# Patient Record
Sex: Male | Born: 1975 | Race: White | Hispanic: No | Marital: Married | Smoking: Former smoker
Health system: Southern US, Community
[De-identification: ages and names within clinical notes are randomized; demographics above are authoritative.]

## PROBLEM LIST (undated history)

## (undated) DIAGNOSIS — J343 Hypertrophy of nasal turbinates: Secondary | ICD-10-CM

## (undated) DIAGNOSIS — F32A Depression, unspecified: Secondary | ICD-10-CM

## (undated) DIAGNOSIS — G4730 Sleep apnea, unspecified: Secondary | ICD-10-CM

## (undated) DIAGNOSIS — K219 Gastro-esophageal reflux disease without esophagitis: Secondary | ICD-10-CM

## (undated) DIAGNOSIS — F329 Major depressive disorder, single episode, unspecified: Secondary | ICD-10-CM

## (undated) DIAGNOSIS — E7800 Pure hypercholesterolemia, unspecified: Secondary | ICD-10-CM

## (undated) DIAGNOSIS — J342 Deviated nasal septum: Secondary | ICD-10-CM

## (undated) DIAGNOSIS — F419 Anxiety disorder, unspecified: Secondary | ICD-10-CM

## (undated) DIAGNOSIS — I10 Essential (primary) hypertension: Secondary | ICD-10-CM

## (undated) HISTORY — DX: Pure hypercholesterolemia, unspecified: E78.00

## (undated) HISTORY — PX: WISDOM TOOTH EXTRACTION: SHX21

## (undated) HISTORY — DX: Essential (primary) hypertension: I10

---

## 2018-03-25 ENCOUNTER — Encounter: Payer: Self-pay | Admitting: Neurology

## 2018-03-26 ENCOUNTER — Ambulatory Visit: Payer: Commercial Managed Care - PPO | Admitting: Neurology

## 2018-03-26 ENCOUNTER — Encounter: Payer: Self-pay | Admitting: Neurology

## 2018-03-26 VITALS — BP 145/82 | HR 73 | Ht 71.0 in | Wt 238.0 lb

## 2018-03-26 DIAGNOSIS — Z9189 Other specified personal risk factors, not elsewhere classified: Secondary | ICD-10-CM | POA: Diagnosis not present

## 2018-03-26 DIAGNOSIS — G4733 Obstructive sleep apnea (adult) (pediatric): Secondary | ICD-10-CM

## 2018-03-26 NOTE — Progress Notes (Signed)
Subjective:    Patient ID: Geoffrey Barrera is a 42 y.o. male.  HPI     Star Age, MD, PhD [facility] [address] [place], [place] [address]  Dear Rennis Petty,   I saw your patient, Geoffrey Barrera, upon your kind request in my neurologic clinic today for initial consultation of his sleep disorder, in particular, his prior diagnosis of sleep apnea for which he has been on BiPAP therapy. The patient is accompanied by his wife today. As you know, Geoffrey Barrera is a 42 year old right-handed gentleman with an underlying medical history of hypertension, hyperlipidemia, reflux disease and obesity, who was previously diagnosed with obstructive sleep apnea and placed on BiPAP therapy. He had sleep study testing in July 2012. I was able to review the results. His baseline AHI was 22.9 per hour, he was then placed on BiPAP therapy, 14/9 cm via full facemask. He has been compliant with treatment. A couple months ago he started having grafting sensations at night, sometimes he woke up in a panic. This prompted him to go to the emergency room twice in February. He was treated for a sinus infection, he has seen ENT and was diagnosed with a septal deviation. He has discontinued the use of nasal decongestant spray and admits that he was overusing this. He has started utilizing allergy medication and also saline rinses. Some 2 months ago he was able to receive a new BiPAP machine. I reviewed his BiPAP compliance data from 02/24/2018 through 03/25/2018 which is a total of 30 days, during which time he used his machine every night with percent used days greater than 4 hours at 100%, indicating superb compliance with an average usage of 8 hours and 18 minutes, residual AHI borderline at 6.6, leak in the acceptable range at 19.9 L/m for the 95th percentile, pressure at 14/9 cm. He uses a large area of 24 facemask and is tolerant of this mask. He has had less panic-like feeling  at night unless gasping for air sensation. He does admit to being prone to anxiety and has had the occasional panic attack in the past. He has not address this before. 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 He lives with his wife and 2 children. He quit smoking some 7 years ago and drinks alcohol occasionally, caffeine occasionally in the form of tea. Since his original sleep apnea diagnosis he had gained a lot of weight, in the realm of 60 pounds, he has started working on weight loss and has already been able to lose weight. He started walking on a regular basis. His Epworth sleepiness score is 14 out of 24, fatigue score is 45 out of 63. I reviewed your office note from 03/05/2018, which you kindly included.   His Past Medical History Is Significant For: Past Medical History:  Diagnosis Date  . High cholesterol   . Hypertension    His Past Surgical History Is Significant For:  His Family History Is Significant For: Family History  Problem Relation Age of Onset  . Cancer Maternal Grandmother   . Stroke Maternal Grandfather     His Social History Is Significant For: Social History   Socioeconomic History  . Marital status: Married    Spouse name: Not on file  . Number of children: Not on file  . Years of education: Not on file  . Highest education level: Not on file  Occupational History  . Not on file  Social Needs  . Financial resource strain: Not on file  . Food insecurity:    Worry: Not on file    Inability: Not on file  . Transportation needs:    Medical: Not on file    Non-medical: Not on file  Tobacco Use  . Smoking status: Former Research scientist (life sciences)  . Smokeless tobacco: Never Used  Substance and Sexual Activity  . Alcohol use: Yes    Alcohol/week: 1.2 oz    Types: 2 Standard drinks or equivalent per week  . Drug use: Not on file  . Sexual activity: Not on file  Lifestyle  . Physical activity:    Days per week: Not on file    Minutes per session:  Not on file  . Stress: Not on file  Relationships  . Social connections:    Talks on phone: Not on file    Gets together: Not on file    Attends religious service: Not on file    Active member of club or organization: Not on file    Attends meetings of clubs or organizations: Not on file    Relationship status: Not on file  Other Topics Concern  . Not on file  Social History Narrative  . Not on file    His Allergies Are:  Allergies  Allergen Reactions  . Latex   :   His Current Medications Are:  Outpatient Encounter Medications as of 03/26/2018  Medication Sig  . cetirizine (ZYRTEC) 10 MG tablet Take 10 mg by mouth daily.  . diphenhydrAMINE (BENADRYL) 25 MG tablet Take 50 mg by mouth daily.  . fluticasone (FLONASE) 50 MCG/ACT nasal spray 2 sprays 2 (two) times daily.  Marland Kitchen ibuprofen (ADVIL,MOTRIN) 200 MG tablet Take 200 mg by mouth every 6 (six) hours as needed.  Marland Kitchen lisinopril (PRINIVIL,ZESTRIL) 20 MG tablet Take 20 mg by mouth daily.  . Melatonin 5 MG TABS Take by mouth.  . Multiple Vitamin (MULTIVITAMIN) capsule Take 1 capsule by mouth daily.  . ranitidine (ZANTAC) 150 MG tablet Take 150 mg by mouth daily.  . simvastatin (ZOCOR) 20 MG tablet Take 20 mg by mouth daily.   No facility-administered encounter medications on file as of 03/26/2018.   :  Review of Systems:  Out of a complete 14 point review of systems, all are reviewed and negative with the exception of these symptoms as listed below: Review of Systems  Neurological:       Pt presents today to discuss his sleep. Pt has been on bipap sine 2012. Recently, he noticed was gasping for breath when trying to go to sleep and had trouble sleeping. Pt received a new bipap from [facility] in March, ordered by his PCP.  Epworth Sleepiness Scale 0= would never doze 1= slight chance of dozing 2= moderate chance of dozing 3= high chance of dozing  Sitting and reading: 3 Watching TV: 3 Sitting inactive in a public place (ex. Theater  or meeting): 1 As a passenger in a car for an hour without a break: 3 Lying down to rest in the afternoon: 3 Sitting and talking to someone: 0 Sitting quietly after lunch (no alcohol): 1 In a car, while stopped in traffic: 0 Total: 14     Objective:  Neurological Exam  Physical Exam Physical Examination:   Vitals:   03/26/18 1050  BP: (!) 145/82  Pulse: 73   General Examination: The patient is a very pleasant 42 y.o. male in no acute distress. He appears well-developed and well-nourished and  well groomed. Full beard. He is mildly anxious appearing.  HEENT: Normocephalic, atraumatic, pupils are equal, round and reactive to light. Extraocular tracking is good without limitation to gaze excursion or nystagmus noted. Normal smooth pursuit is noted. Hearing is grossly intact. Tympanic membranes are clear bilaterally. Face is symmetric with normal facial animation and normal facial sensation. Speech is clear with no dysarthria noted. There is no hypophonia. There is no lip, neck/head, jaw or voice tremor. Neck is supple with full range of passive and active motion. There are no carotid bruits on auscultation. Oropharynx exam reveals: mild mouth dryness, adequate dental hygiene and moderate airway crowding, due to smaller airway entry, longer uvula, wider tongue. Tonsils are small. Mallampati is class III. Neck circumference is 18-1/2 inches. He has a mild overbite. Nasal inspection reveals no significant mucosal bogginess or redness but he has mild septal deviation to the right.  Chest: Clear to auscultation without wheezing, rhonchi or crackles noted.  Heart: S1+S2+0, regular and normal without murmurs, rubs or gallops noted.   Abdomen: Soft, non-tender and non-distended with normal bowel sounds appreciated on auscultation.  Extremities: There is no obvious edema.  Skin: Warm and dry without trophic changes noted.  Musculoskeletal: exam reveals no obvious joint deformities, tenderness  or joint swelling or erythema.   Neurologically:  Mental status: The patient is awake, alert and oriented in all 4 spheres. His immediate and remote memory, attention, language skills and fund of knowledge are appropriate. There is no evidence of aphasia, agnosia, apraxia or anomia. Speech is clear with normal prosody and enunciation. Thought process is linear. Mood is normal and affect is slightly blunted.  Cranial nerves II - XII are as described above under HEENT exam. In addition: shoulder shrug is normal with equal shoulder height noted. Motor exam: Normal bulk, strength and tone is noted. There is no tremor. Fine motor skills and coordination: grossly intact.  Cerebellar testing: No dysmetria or intention tremor on finger to nose testing. Heel to shin is unremarkable bilaterally. There is no truncal or gait ataxia.  Sensory exam: intact to light touch.  Gait, station and balance: He stands easily. No veering to one side is noted. No leaning to one side is noted. Posture is age-appropriate and stance is narrow based. Gait shows normal stride length and normal pace. No problems turning are noted.   Assessment and Plan:  In summary, Geoffrey Barrera is a very pleasant 42 y.o.-year old male with an underlying medical history of hypertension, hyperlipidemia, reflux disease and obesity, who was Was diagnosed with moderate obstructive sleep apnea in 2012 and placed on BiPAP therapy. He had recent issues with waking up with a sense of gasping for air. He had interim weight gain since his original sleep apnea diagnosis has been able to lose weight in the past 2 months or so. He is commended for his weight loss endeavors. He is fully compliant with BiPAP therapy and has been able to get a new machine in the past couple of months. His sleep apnea is well treated at this time, residual AHI in the past month borderline at 6.6, nevertheless, with ongoing weight loss this will likely improve. He is encouraged to  continue to work on weight loss and be fully compliant with his BiPAP, he is commended for his BiPAP treatment adherence. He is utilizing a full facemask size large at this time. He is tolerant of the mask and does not mind using a fullface mask, leak is acceptable as well. From  my end of things, nothing needs to be changed at this time. He does admit to having occasional anxiety and waking up with a panic. His recent emergency room visits may have been in part triggered by anxiety, he was also treated with oral steroids and oral antibiotics recently for an acute on chronic sinusitis. He has seen ENT. He is on allergy medication and has discontinued his use of nasal decongestant spray which he by self admission was overusing. I encouraged the patient to talk to you about anxiety management and stress. I will see him back on an as-needed basis. I answered all their questions today and the patient and his wife were in agreement. Of note, his wife is a respiratory therapist and is very familiar with CPAP and BiPAP machines and has been very supportive.  Thank you very much for allowing me to participate in the care of this nice patient. If I can be of any further assistance to you please do not hesitate to call me at [phone].  Sincerely,   Star Age, MD, PhD

## 2018-03-26 NOTE — Patient Instructions (Addendum)
Please continue using your BiPAP regularly. While your insurance requires that you use BiPAP at least 4 hours each night on 70% of the nights, I recommend, that you not skip any nights and use it throughout the night if you can. Getting used to BiPAP and staying with the treatment long term does take time and patience and discipline. Untreated obstructive sleep apnea when it is moderate to severe can have an adverse impact on cardiovascular health and raise her risk for heart disease, arrhythmias, hypertension, congestive heart failure, stroke and diabetes. Untreated obstructive sleep apnea causes sleep disruption, nonrestorative sleep, and sleep deprivation. This can have an impact on your day to day functioning and cause daytime sleepiness and impairment of cognitive function, memory loss, mood disturbance, and problems focussing. Using BiPAP regularly can improve these symptoms.  You have excellent compliance with your BiPAP machine.  I do not recommend any changes in your pressure settings or your mask at this time. You can follow-up with your primary care physician. I would recommend that you talk to him about your anxiety and possible management strategies for anxiety and stress.  I will see you back on an as-needed basis. Keep up the good work with your BiPAP usage and also your weight loss endeavors.

## 2018-05-18 ENCOUNTER — Ambulatory Visit (INDEPENDENT_AMBULATORY_CARE_PROVIDER_SITE_OTHER): Payer: Commercial Managed Care - PPO | Admitting: Otolaryngology

## 2018-05-18 DIAGNOSIS — J320 Chronic maxillary sinusitis: Secondary | ICD-10-CM

## 2018-05-18 DIAGNOSIS — J343 Hypertrophy of nasal turbinates: Secondary | ICD-10-CM

## 2018-05-18 DIAGNOSIS — J342 Deviated nasal septum: Secondary | ICD-10-CM

## 2018-05-29 ENCOUNTER — Other Ambulatory Visit: Payer: Self-pay | Admitting: Otolaryngology

## 2018-06-30 ENCOUNTER — Encounter (HOSPITAL_BASED_OUTPATIENT_CLINIC_OR_DEPARTMENT_OTHER): Payer: Self-pay | Admitting: *Deleted

## 2018-06-30 ENCOUNTER — Other Ambulatory Visit: Payer: Self-pay

## 2018-07-02 ENCOUNTER — Other Ambulatory Visit (INDEPENDENT_AMBULATORY_CARE_PROVIDER_SITE_OTHER): Payer: Self-pay | Admitting: Internal Medicine

## 2018-07-02 ENCOUNTER — Encounter (INDEPENDENT_AMBULATORY_CARE_PROVIDER_SITE_OTHER): Payer: Self-pay | Admitting: Internal Medicine

## 2018-07-02 ENCOUNTER — Encounter (HOSPITAL_COMMUNITY)
Admission: RE | Admit: 2018-07-02 | Discharge: 2018-07-02 | Disposition: A | Discharge status: Home / Self Care | Payer: Commercial Managed Care - PPO | Source: Ambulatory Visit | Attending: Otolaryngology | Admitting: Otolaryngology

## 2018-07-02 ENCOUNTER — Ambulatory Visit (INDEPENDENT_AMBULATORY_CARE_PROVIDER_SITE_OTHER): Payer: Commercial Managed Care - PPO | Admitting: Internal Medicine

## 2018-07-02 VITALS — BP 160/80 | HR 80 | Temp 98.4°F | Ht 71.0 in | Wt 234.5 lb

## 2018-07-02 DIAGNOSIS — K625 Hemorrhage of anus and rectum: Secondary | ICD-10-CM | POA: Diagnosis not present

## 2018-07-02 DIAGNOSIS — I10 Essential (primary) hypertension: Secondary | ICD-10-CM | POA: Insufficient documentation

## 2018-07-02 DIAGNOSIS — R001 Bradycardia, unspecified: Secondary | ICD-10-CM | POA: Diagnosis not present

## 2018-07-02 DIAGNOSIS — R1013 Epigastric pain: Secondary | ICD-10-CM

## 2018-07-02 DIAGNOSIS — Z01810 Encounter for preprocedural cardiovascular examination: Secondary | ICD-10-CM | POA: Insufficient documentation

## 2018-07-02 NOTE — Patient Instructions (Signed)
EGD/Colonoscopy with propofol. The risks of bleeding, perforation and infection were reviewed with patient.

## 2018-07-02 NOTE — Progress Notes (Signed)
Subjective:    Patient ID: Geoffrey Barrera, male    DOB: 1976/06/07, 42 y.o.   MRN: [id number]  HPI Referred by Dr. Woody Seller for bloody stools. Seen at [facility] in July with epigastric pain.  Also has been having intermittent blood in his stool.  Underwent a CT scan for abdominal pain for a week. There were no acute findings. Colonic diverticulosis with radiographic evidence of diverticulitis.  He states he has epigastric pain and also states he has pain when he has BM.  He has seen [facility] (toilet tissue) at times.   He has soreness in his rectum with his BM. He has had the soreness for about 10 yrs. The epigastric pain for about 2 weeks. He had a gnawing sensation in his epigastric region in March. He started Omeprazole and his symptoms resolved. Seen in the ED in July and started on Omeprazole 20mg  and his symptoms resolved. He has finished the Carafate. He says he was taking Ibuprofen x 2 a day for months. Has stopped now.  Has a BM x 1 a day and sometimes 2 a day.  States his stools have been hard. He says he has lost from 265 to 234 which was intentional. He said he did not want to eat because of the pain.   States he has been drinking Citrucel which has helped with his stools. Stools are not as hard now. States has hx of IBS.  He says his appetite is back and he is gaining his weight back.    No family hx of colon cancer. Mother had Crohn's disease. Deceased now from cirrhosis from Hepatitis age 36. 02/20/2018 H and H 13.8 and 40.5  Review of Systems Past Medical History:  Diagnosis Date  . Anxiety   . Depression   . Deviated septum   . GERD (gastroesophageal reflux disease)   . High cholesterol   . Hypertension   . Hypertrophy of nasal turbinates   . Sleep apnea    wears BIPAP nightly    Past Surgical History:  Procedure Laterality Date  . WISDOM TOOTH EXTRACTION      Allergies  Allergen Reactions  . Latex     Current Outpatient Medications on File Prior to Visit  Medication  Sig Dispense Refill  . busPIRone (BUSPAR) 15 MG tablet Take 15 mg by mouth as needed.    . cetirizine (ZYRTEC) 10 MG tablet Take 10 mg by mouth daily.    . diphenhydrAMINE (BENADRYL) 25 MG tablet Take 50 mg by mouth daily.    . fluticasone (FLONASE) 50 MCG/ACT nasal spray 2 sprays 2 (two) times daily.  2  . ibuprofen (ADVIL,MOTRIN) 200 MG tablet Take 200 mg by mouth every 6 (six) hours as needed.    Marland Kitchen lisinopril (PRINIVIL,ZESTRIL) 20 MG tablet Take 20 mg by mouth daily.  4  . Multiple Vitamin (MULTIVITAMIN) capsule Take 1 capsule by mouth daily.    Marland Kitchen omeprazole (PRILOSEC) 20 MG capsule Take 20 mg by mouth daily.    Marland Kitchen PARoxetine (PAXIL) 10 MG tablet Take 10 mg by mouth daily.    . simvastatin (ZOCOR) 20 MG tablet Take 20 mg by mouth daily.  4  . sucralfate (CARAFATE) 1 g tablet Take 1 g by mouth 4 (four) times daily -  with meals and at bedtime.     No current facility-administered medications on file prior to visit.         Objective:   Physical Exam Blood pressure (!) 160/80, pulse 80,  temperature 98.4 F (36.9 C), height 5\' 11"  (1.803 m), weight 234 lb 8 oz (106.4 kg). Alert and oriented. Skin warm and dry. Oral mucosa is moist.   . Sclera anicteric, conjunctivae is pink. Thyroid not enlarged. No cervical lymphadenopathy. Lungs clear. Heart regular rate and rhythm.  Abdomen is soft. Bowel sounds are positive. No hepatomegaly. No abdominal masses felt. No tenderness.  No edema to lower extremities. Rectal exam: No masses felt. Guaiac negative.       Assessment & Plan:  Rectal bleeding. Colon neoplasm needs to be ruled out. Hemorrhoids,polyps in the differential. Epigastric pain: Had been taking Ibuprofen which he has stopped. Symptoms are better but I think he needs EGD to evaluate for possible ulcer or gastritis.

## 2018-07-03 ENCOUNTER — Encounter (INDEPENDENT_AMBULATORY_CARE_PROVIDER_SITE_OTHER): Payer: Self-pay | Admitting: *Deleted

## 2018-07-03 ENCOUNTER — Telehealth (INDEPENDENT_AMBULATORY_CARE_PROVIDER_SITE_OTHER): Payer: Self-pay | Admitting: *Deleted

## 2018-07-03 DIAGNOSIS — R1013 Epigastric pain: Secondary | ICD-10-CM | POA: Insufficient documentation

## 2018-07-03 DIAGNOSIS — K625 Hemorrhage of anus and rectum: Secondary | ICD-10-CM | POA: Insufficient documentation

## 2018-07-03 NOTE — Telephone Encounter (Signed)
Patient needs suprep 

## 2018-07-06 ENCOUNTER — Encounter (HOSPITAL_BASED_OUTPATIENT_CLINIC_OR_DEPARTMENT_OTHER): Payer: Self-pay

## 2018-07-06 ENCOUNTER — Ambulatory Visit (HOSPITAL_BASED_OUTPATIENT_CLINIC_OR_DEPARTMENT_OTHER)
Admission: RE | Admit: 2018-07-06 | Discharge: 2018-07-07 | Disposition: A | Discharge status: Home / Self Care | Payer: Commercial Managed Care - PPO | Source: Ambulatory Visit | Attending: Otolaryngology | Admitting: Otolaryngology

## 2018-07-06 ENCOUNTER — Encounter (HOSPITAL_BASED_OUTPATIENT_CLINIC_OR_DEPARTMENT_OTHER)
Admission: RE | Disposition: A | Discharge status: Home / Self Care | Payer: Self-pay | Source: Ambulatory Visit | Attending: Otolaryngology

## 2018-07-06 ENCOUNTER — Ambulatory Visit (HOSPITAL_BASED_OUTPATIENT_CLINIC_OR_DEPARTMENT_OTHER): Payer: Commercial Managed Care - PPO | Admitting: Anesthesiology

## 2018-07-06 ENCOUNTER — Other Ambulatory Visit: Payer: Self-pay

## 2018-07-06 DIAGNOSIS — Z79899 Other long term (current) drug therapy: Secondary | ICD-10-CM | POA: Diagnosis not present

## 2018-07-06 DIAGNOSIS — K625 Hemorrhage of anus and rectum: Secondary | ICD-10-CM

## 2018-07-06 DIAGNOSIS — J3489 Other specified disorders of nose and nasal sinuses: Secondary | ICD-10-CM | POA: Insufficient documentation

## 2018-07-06 DIAGNOSIS — J338 Other polyp of sinus: Secondary | ICD-10-CM | POA: Insufficient documentation

## 2018-07-06 DIAGNOSIS — K219 Gastro-esophageal reflux disease without esophagitis: Secondary | ICD-10-CM | POA: Insufficient documentation

## 2018-07-06 DIAGNOSIS — J320 Chronic maxillary sinusitis: Secondary | ICD-10-CM | POA: Insufficient documentation

## 2018-07-06 DIAGNOSIS — Z823 Family history of stroke: Secondary | ICD-10-CM | POA: Insufficient documentation

## 2018-07-06 DIAGNOSIS — Z9889 Other specified postprocedural states: Secondary | ICD-10-CM

## 2018-07-06 DIAGNOSIS — Z87891 Personal history of nicotine dependence: Secondary | ICD-10-CM | POA: Diagnosis not present

## 2018-07-06 DIAGNOSIS — G4730 Sleep apnea, unspecified: Secondary | ICD-10-CM | POA: Diagnosis not present

## 2018-07-06 DIAGNOSIS — Z7951 Long term (current) use of inhaled steroids: Secondary | ICD-10-CM | POA: Insufficient documentation

## 2018-07-06 DIAGNOSIS — J342 Deviated nasal septum: Secondary | ICD-10-CM

## 2018-07-06 DIAGNOSIS — J343 Hypertrophy of nasal turbinates: Secondary | ICD-10-CM | POA: Diagnosis present

## 2018-07-06 DIAGNOSIS — I10 Essential (primary) hypertension: Secondary | ICD-10-CM | POA: Diagnosis not present

## 2018-07-06 DIAGNOSIS — R1013 Epigastric pain: Secondary | ICD-10-CM

## 2018-07-06 HISTORY — DX: Sleep apnea, unspecified: G47.30

## 2018-07-06 HISTORY — DX: Hypertrophy of nasal turbinates: J34.3

## 2018-07-06 HISTORY — PX: NASAL SINUS SURGERY: SHX719

## 2018-07-06 HISTORY — DX: Gastro-esophageal reflux disease without esophagitis: K21.9

## 2018-07-06 HISTORY — DX: Anxiety disorder, unspecified: F41.9

## 2018-07-06 HISTORY — DX: Major depressive disorder, single episode, unspecified: F32.9

## 2018-07-06 HISTORY — DX: Deviated nasal septum: J34.2

## 2018-07-06 HISTORY — PX: NASAL SEPTOPLASTY W/ TURBINOPLASTY: SHX2070

## 2018-07-06 HISTORY — DX: Depression, unspecified: F32.A

## 2018-07-06 SURGERY — SEPTOPLASTY, NOSE, WITH NASAL TURBINATE REDUCTION
Anesthesia: General | Site: Nose | Laterality: Bilateral

## 2018-07-06 MED ORDER — CEFAZOLIN SODIUM-DEXTROSE 2-3 GM-%(50ML) IV SOLR
INTRAVENOUS | Status: DC | PRN
  Administered 2018-07-06: 2 g via INTRAVENOUS

## 2018-07-06 MED ORDER — LISINOPRIL 20 MG PO TABS: 20.0000 mg | ORAL_TABLET | Freq: Every day | ORAL | Status: DC

## 2018-07-06 MED ORDER — PROPOFOL 10 MG/ML IV BOLUS
INTRAVENOUS | Status: DC | PRN
  Administered 2018-07-06: 250 mg via INTRAVENOUS

## 2018-07-06 MED ORDER — OXYMETAZOLINE HCL 0.05 % NA SOLN
NASAL | Status: DC | PRN
  Administered 2018-07-06: 1 via TOPICAL

## 2018-07-06 MED ORDER — DEXAMETHASONE SODIUM PHOSPHATE 4 MG/ML IJ SOLN
INTRAMUSCULAR | Status: DC | PRN
  Administered 2018-07-06: 10 mg via INTRAVENOUS

## 2018-07-06 MED ORDER — OXYCODONE-ACETAMINOPHEN 5-325 MG PO TABS
1.0000 | ORAL_TABLET | ORAL | Status: DC | PRN
  Administered 2018-07-06 – 2018-07-07 (×3): 2 via ORAL
  Filled 2018-07-06 (×5): qty 2

## 2018-07-06 MED ORDER — AMOXICILLIN 875 MG PO TABS: 875.0000 mg | ORAL_TABLET | Freq: Two times a day (BID) | ORAL | 0 refills | Status: AC

## 2018-07-06 MED ORDER — MIDAZOLAM HCL 2 MG/2ML IJ SOLN
1.0000 mg | INTRAMUSCULAR | Status: DC | PRN
  Administered 2018-07-06: 2 mg via INTRAVENOUS

## 2018-07-06 MED ORDER — SODIUM CHLORIDE 0.9 % IR SOLN
Status: DC | PRN
  Administered 2018-07-06: 100 mL

## 2018-07-06 MED ORDER — FENTANYL CITRATE (PF) 100 MCG/2ML IJ SOLN
50.0000 ug | INTRAMUSCULAR | Status: AC | PRN
  Administered 2018-07-06: 25 ug via INTRAVENOUS
  Administered 2018-07-06: 100 ug via INTRAVENOUS
  Administered 2018-07-06: 25 ug via INTRAVENOUS

## 2018-07-06 MED ORDER — MIDAZOLAM HCL 2 MG/2ML IJ SOLN
INTRAMUSCULAR | Status: AC
  Filled 2018-07-06: qty 2

## 2018-07-06 MED ORDER — CHLORHEXIDINE GLUCONATE CLOTH 2 % EX PADS: 6.0000 | MEDICATED_PAD | Freq: Once | CUTANEOUS | Status: DC

## 2018-07-06 MED ORDER — ONDANSETRON HCL 4 MG PO TABS: 4.0000 mg | ORAL_TABLET | ORAL | Status: DC | PRN

## 2018-07-06 MED ORDER — FENTANYL CITRATE (PF) 100 MCG/2ML IJ SOLN
INTRAMUSCULAR | Status: AC
  Filled 2018-07-06: qty 2

## 2018-07-06 MED ORDER — LORATADINE 10 MG PO TABS: 10.0000 mg | ORAL_TABLET | Freq: Every day | ORAL | Status: DC

## 2018-07-06 MED ORDER — MEPERIDINE HCL 25 MG/ML IJ SOLN: 6.2500 mg | INTRAMUSCULAR | Status: DC | PRN

## 2018-07-06 MED ORDER — PANTOPRAZOLE SODIUM 40 MG PO TBEC: 40.0000 mg | DELAYED_RELEASE_TABLET | Freq: Every day | ORAL | Status: DC

## 2018-07-06 MED ORDER — DIPHENHYDRAMINE HCL 25 MG PO TABS: 50.0000 mg | ORAL_TABLET | Freq: Every day | ORAL | Status: DC

## 2018-07-06 MED ORDER — LIDOCAINE 2% (20 MG/ML) 5 ML SYRINGE
INTRAMUSCULAR | Status: AC
  Filled 2018-07-06: qty 5

## 2018-07-06 MED ORDER — ROCURONIUM BROMIDE 100 MG/10ML IV SOLN
INTRAVENOUS | Status: DC | PRN
  Administered 2018-07-06: 50 mg via INTRAVENOUS

## 2018-07-06 MED ORDER — LIDOCAINE-EPINEPHRINE 1 %-1:100000 IJ SOLN
INTRAMUSCULAR | Status: DC | PRN
  Administered 2018-07-06: 4 mL

## 2018-07-06 MED ORDER — ONDANSETRON HCL 4 MG/2ML IJ SOLN
INTRAMUSCULAR | Status: AC
  Filled 2018-07-06: qty 2

## 2018-07-06 MED ORDER — PAROXETINE HCL 10 MG PO TABS: 10.0000 mg | ORAL_TABLET | Freq: Every day | ORAL | Status: DC

## 2018-07-06 MED ORDER — KCL IN DEXTROSE-NACL 20-5-0.45 MEQ/L-%-% IV SOLN
INTRAVENOUS | Status: DC
  Administered 2018-07-06: 16:00:00 via INTRAVENOUS
  Filled 2018-07-06: qty 1000

## 2018-07-06 MED ORDER — METOCLOPRAMIDE HCL 5 MG/ML IJ SOLN: 10.0000 mg | Freq: Once | INTRAMUSCULAR | Status: DC | PRN

## 2018-07-06 MED ORDER — MUPIROCIN 2 % EX OINT
TOPICAL_OINTMENT | CUTANEOUS | Status: DC | PRN
  Administered 2018-07-06: 1 via NASAL

## 2018-07-06 MED ORDER — ONDANSETRON HCL 4 MG/2ML IJ SOLN
INTRAMUSCULAR | Status: DC | PRN
  Administered 2018-07-06: 4 mg via INTRAVENOUS

## 2018-07-06 MED ORDER — OXYCODONE-ACETAMINOPHEN 5-325 MG PO TABS: 1.0000 | ORAL_TABLET | ORAL | 0 refills | Status: DC | PRN

## 2018-07-06 MED ORDER — DEXAMETHASONE SODIUM PHOSPHATE 10 MG/ML IJ SOLN
INTRAMUSCULAR | Status: AC
  Filled 2018-07-06: qty 1

## 2018-07-06 MED ORDER — SIMVASTATIN 20 MG PO TABS: 20.0000 mg | ORAL_TABLET | Freq: Every day | ORAL | Status: DC

## 2018-07-06 MED ORDER — PROPOFOL 10 MG/ML IV BOLUS
INTRAVENOUS | Status: AC
  Filled 2018-07-06: qty 20

## 2018-07-06 MED ORDER — LACTATED RINGERS IV SOLN: INTRAVENOUS | Status: DC

## 2018-07-06 MED ORDER — FENTANYL CITRATE (PF) 100 MCG/2ML IJ SOLN
25.0000 ug | INTRAMUSCULAR | Status: DC | PRN
  Administered 2018-07-06: 25 ug via INTRAVENOUS
  Administered 2018-07-06: 50 ug via INTRAVENOUS

## 2018-07-06 MED ORDER — MORPHINE SULFATE (PF) 4 MG/ML IV SOLN: 2.0000 mg | INTRAVENOUS | Status: DC | PRN

## 2018-07-06 MED ORDER — LACTATED RINGERS IV SOLN
INTRAVENOUS | Status: DC
  Administered 2018-07-06 (×2): via INTRAVENOUS

## 2018-07-06 MED ORDER — SCOPOLAMINE 1 MG/3DAYS TD PT72: 1.0000 | MEDICATED_PATCH | Freq: Once | TRANSDERMAL | Status: DC | PRN

## 2018-07-06 MED ORDER — ONDANSETRON HCL 4 MG/2ML IJ SOLN: 4.0000 mg | INTRAMUSCULAR | Status: DC | PRN

## 2018-07-06 MED ORDER — SUGAMMADEX SODIUM 200 MG/2ML IV SOLN
INTRAVENOUS | Status: DC | PRN
  Administered 2018-07-06: 200 mg via INTRAVENOUS

## 2018-07-06 SURGICAL SUPPLY — 55 items
ATTRACTOMAT 16X20 MAGNETIC DRP (DRAPES) IMPLANT
BLADE RAD40 ROTATE 4M 4 5PK (BLADE) IMPLANT
BLADE RAD40 ROTATE 4M 4MM 5PK (BLADE)
BLADE RAD60 ROTATE M4 4 5PK (BLADE) IMPLANT
BLADE RAD60 ROTATE M4 4MM 5PK (BLADE)
BLADE TRICUT ROTATE M4 4 5PK (BLADE) IMPLANT
BLADE TRICUT ROTATE M4 4MM 5PK (BLADE)
BUR HS RAD FRONTAL 3 (BURR) IMPLANT
BUR HS RAD FRONTAL 3MM (BURR)
CANISTER SUC SOCK COL 7IN (MISCELLANEOUS) ×3 IMPLANT
CANISTER SUCT 1200ML W/VALVE (MISCELLANEOUS) ×3 IMPLANT
COAGULATOR SUCT 6 FR SWTCH (ELECTROSURGICAL)
COAGULATOR SUCT 8FR VV (MISCELLANEOUS) ×3 IMPLANT
COAGULATOR SUCT SWTCH 10FR 6 (ELECTROSURGICAL) IMPLANT
DECANTER SPIKE VIAL GLASS SM (MISCELLANEOUS) IMPLANT
DRSG NASAL KENNEDY LMNT 8CM (GAUZE/BANDAGES/DRESSINGS) IMPLANT
DRSG NASOPORE 8CM (GAUZE/BANDAGES/DRESSINGS) ×3 IMPLANT
DRSG TELFA 3X8 NADH (GAUZE/BANDAGES/DRESSINGS) IMPLANT
ELECT REM PT RETURN 9FT ADLT (ELECTROSURGICAL) ×3
ELECTRODE REM PT RTRN 9FT ADLT (ELECTROSURGICAL) ×1 IMPLANT
GLOVE BIO SURGEON STRL SZ7.5 (GLOVE) IMPLANT
GLOVE SURG SS PI 7.0 STRL IVOR (GLOVE) ×3 IMPLANT
GLOVE SURG SS PI 7.5 STRL IVOR (GLOVE) ×3 IMPLANT
GOWN STRL REUS W/ TWL LRG LVL3 (GOWN DISPOSABLE) ×1 IMPLANT
GOWN STRL REUS W/ TWL XL LVL3 (GOWN DISPOSABLE) ×1 IMPLANT
GOWN STRL REUS W/TWL LRG LVL3 (GOWN DISPOSABLE) ×2
GOWN STRL REUS W/TWL XL LVL3 (GOWN DISPOSABLE) ×2
HEMOSTAT SURGICEL 2X14 (HEMOSTASIS) IMPLANT
IV NS 500ML (IV SOLUTION) ×2
IV NS 500ML BAXH (IV SOLUTION) ×1 IMPLANT
IV SET EXT 30 76VOL 4 MALE LL (IV SETS) IMPLANT
NEEDLE HYPO 25X1 1.5 SAFETY (NEEDLE) ×3 IMPLANT
NEEDLE SPNL 25GX3.5 QUINCKE BL (NEEDLE) IMPLANT
NS IRRIG 1000ML POUR BTL (IV SOLUTION) ×3 IMPLANT
PACK BASIN DAY SURGERY FS (CUSTOM PROCEDURE TRAY) ×3 IMPLANT
PACK ENT DAY SURGERY (CUSTOM PROCEDURE TRAY) ×3 IMPLANT
PACKING NASAL EPIS 4X2.4 XEROG (MISCELLANEOUS) IMPLANT
SLEEVE SCD COMPRESS KNEE MED (MISCELLANEOUS) ×3 IMPLANT
SOLUTION BUTLER CLEAR DIP (MISCELLANEOUS) ×3 IMPLANT
SPLINT NASAL AIRWAY SILICONE (MISCELLANEOUS) ×3 IMPLANT
SPONGE GAUZE 2X2 8PLY STER LF (GAUZE/BANDAGES/DRESSINGS) ×1
SPONGE GAUZE 2X2 8PLY STRL LF (GAUZE/BANDAGES/DRESSINGS) ×2 IMPLANT
SPONGE NEURO XRAY DETECT 1X3 (DISPOSABLE) ×3 IMPLANT
SUT CHROMIC 4 0 P 3 18 (SUTURE) ×3 IMPLANT
SUT ETHILON 3 0 PS 1 (SUTURE) IMPLANT
SUT PLAIN 4 0 ~~LOC~~ 1 (SUTURE) ×3 IMPLANT
SUT PROLENE 3 0 PS 2 (SUTURE) ×3 IMPLANT
SUT VIC AB 4-0 P-3 18XBRD (SUTURE) IMPLANT
SUT VIC AB 4-0 P3 18 (SUTURE)
TOWEL GREEN STERILE FF (TOWEL DISPOSABLE) ×3 IMPLANT
TUBE CONNECTING 20'X1/4 (TUBING) ×1
TUBE CONNECTING 20X1/4 (TUBING) ×2 IMPLANT
TUBE SALEM SUMP 12R W/ARV (TUBING) IMPLANT
TUBE SALEM SUMP 16 FR W/ARV (TUBING) ×3 IMPLANT
YANKAUER SUCT BULB TIP NO VENT (SUCTIONS) ×3 IMPLANT

## 2018-07-06 NOTE — Progress Notes (Signed)
Left Dr. Benjamine Mola a voicemail to notify that we are still waiting on overnight orders and need them ASAP in order to transfer patient from PACU to [place].

## 2018-07-06 NOTE — Anesthesia Procedure Notes (Signed)
Procedure Name: Intubation Date/Time: 07/06/2018 11:14 AM Performed by: Lyndee Leo, CRNA Pre-anesthesia Checklist: Patient identified, Emergency Drugs available, Suction available and Patient being monitored Patient Re-evaluated:Patient Re-evaluated prior to induction Oxygen Delivery Method: Circle system utilized Preoxygenation: Pre-oxygenation with 100% oxygen Induction Type: IV induction Ventilation: Mask ventilation without difficulty Laryngoscope Size: Glidescope Grade View: Grade III Tube type: Oral Rae Tube size: 8.0 mm Number of attempts: 1 Airway Equipment and Method: Stylet and Oral airway Placement Confirmation: ETT inserted through vocal cords under direct vision,  positive ETCO2 and breath sounds checked- equal and bilateral Tube secured with: Tape Dental Injury: Teeth and Oropharynx as per pre-operative assessment

## 2018-07-06 NOTE — Discharge Instructions (Addendum)

## 2018-07-06 NOTE — H&P (Signed)
Cc: Chronic nasal obstruction, recurrent sinusitis  HPI: The patient is a 42 y/o male who presents today for evaluation of chronic nasal congestion and recurrent sinus infections. The patient has noted issues with his sinuses for many years. He underwent a sinus CT which showed a septal deviation, turbinate hypertrophy, and bilateral maxillary disease. The patient has been on Flonase since March with little to no relief of his symptoms. He has not been treated with any antibiotics recently. The patient has a history of sleep apnea and uses a biPap. No previous ENT surgery is noted.   The patient's review of systems (constitutional, eyes, ENT, cardiovascular, respiratory, GI, musculoskeletal, skin, neurologic, psychiatric, endocrine, hematologic, allergic) is noted in the ROS questionnaire.  It is reviewed with the patient.   Family health history: Cancer, stroke.   Major events: Hypertension.   Ongoing medical problems: Hypertension.   Social history: The patient is married. He denies the use of tobacco, alcohol or illegal drugs.  Exam: General: Communicates without difficulty, well nourished, no acute distress. Head: Normocephalic, no evidence injury, no tenderness, facial buttresses intact without stepoff. Eyes: PERRL, EOMI. No scleral icterus, conjunctivae clear. Neuro: CN II exam reveals vision grossly intact.  No nystagmus at any point of gaze. Ears: Auricles well formed without lesions.  Ear canals are intact without mass or lesion.  No erythema or edema is appreciated.  The TMs are intact without fluid. Nose: External evaluation reveals normal support and skin without lesions.  Dorsum is intact.  Anterior rhinoscopy reveals congested and edematous mucosa over anterior aspect of the inferior turbinates and nasal septum.  No purulence is noted. Middle meatus is not well visualized. Oral:  Oral cavity and oropharynx are intact, symmetric, without erythema or edema.  Mucosa is moist without  lesions. Neck: Full range of motion without pain.  There is no significant lymphadenopathy.  No masses palpable.  Thyroid bed within normal limits to palpation.  Parotid glands and submandibular glands equal bilaterally without mass.  Trachea is midline. Neuro:  CN 2-12 grossly intact. Gait normal. Vestibular: No nystagmus at any point of gaze.   Procedure:  Flexible Nasal Endoscopy: Risks, benefits, and alternatives of flexible endoscopy were explained to the patient.  Specific mention was made of the risk of throat numbness with difficulty swallowing, possible bleeding from the nose and mouth, and pain from the procedure.  The patient gave oral consent to proceed.  The nasal cavities were decongested and anesthetised with a combination of oxymetazoline and 4% lidocaine solution.  The flexible scope was inserted into the right nasal cavity.  NSD. Endoscopy of the inferior and middle meatus was performed.  The edematous mucosa was as described above.  Polypoid mucosa was noted to obstruct the middle meatus. Olfactory cleft was clear. Nasopharynx was clear.  Turbinates were hypertrophied but without mass.  Incomplete response to decongestion.  The procedure was repeated on the contralateral side with similar findings.  The patient tolerated the procedure well.  Instructions were given to avoid eating or drinking for 2 hours.   Assessment  1. 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  2. No other suspicious mass or lesion is noted on today's nasal endoscopy.  Plan 1. Nasal endoscopy findings are reviewed with the patient.  2. Treatment options include conservative management with daily steroid nasal spray versus septoplasty, bilateral inferior turbinate reduction, and bilateral maxillary  antrostomy. The risks, benefits, alternatives, and details of the  procedure are reviewed with the patient. Questions are invited and answered. 3. The patient is interested in proceeding with the procedures.  We will schedule the procedures in accordance with the family schedule.

## 2018-07-06 NOTE — Anesthesia Preprocedure Evaluation (Signed)
Anesthesia Evaluation  Patient identified by MRN, date of birth, ID band Patient awake    Reviewed: Allergy & Precautions, NPO status , Patient's Chart, lab work & pertinent test results  Airway Mallampati: II  TM Distance: >3 FB Neck ROM: Full    Dental no notable dental hx.    Pulmonary sleep apnea and Continuous Positive Airway Pressure Ventilation , former smoker,    Pulmonary exam normal breath sounds clear to auscultation       Cardiovascular hypertension, Pt. on medications negative cardio ROS Normal cardiovascular exam Rhythm:Regular Rate:Normal     Neuro/Psych negative neurological ROS  negative psych ROS   GI/Hepatic Neg liver ROS, GERD  Medicated and Controlled,  Endo/Other  negative endocrine ROS  Renal/GU negative Renal ROS  negative genitourinary   Musculoskeletal negative musculoskeletal ROS (+)   Abdominal   Peds negative pediatric ROS (+)  Hematology negative hematology ROS (+)   Anesthesia Other Findings   Reproductive/Obstetrics negative OB ROS                             Anesthesia Physical Anesthesia Plan  ASA: II  Anesthesia Plan: General   Post-op Pain Management:    Induction: Intravenous  PONV Risk Score and Plan: 2 and Ondansetron, Dexamethasone and Treatment may vary due to age or medical condition  Airway Management Planned: Oral ETT  Additional Equipment:   Intra-op Plan:   Post-operative Plan: Extubation in OR  Informed Consent: I have reviewed the patients History and Physical, chart, labs and discussed the procedure including the risks, benefits and alternatives for the proposed anesthesia with the patient or authorized representative who has indicated his/her understanding and acceptance.   Dental advisory given  Plan Discussed with: CRNA  Anesthesia Plan Comments:         Anesthesia Quick Evaluation

## 2018-07-06 NOTE — Op Note (Signed)
DATE OF PROCEDURE: 07/06/2018  OPERATIVE REPORT   SURGEON: Leta Baptist, MD   PREOPERATIVE DIAGNOSES:  1. Nasal septal deviation.  2. Bilateral inferior turbinate hypertrophy.  3. Chronic nasal obstruction. 4. Bilateral chronic maxillary sinusitis and polyposis.  POSTOPERATIVE DIAGNOSES:  1. Nasal septal deviation.  2. Bilateral inferior turbinate hypertrophy.  3. Chronic nasal obstruction. 4. Bilateral chronic maxillary sinusitis and polyposis.  PROCEDURE PERFORMED:  1. Septoplasty.  2. Bilateral partial inferior turbinate resection.  3. Bilateral endoscopic maxillary antrostomy with polyp removal.  ANESTHESIA: General endotracheal tube anesthesia.   COMPLICATIONS: None.   ESTIMATED BLOOD LOSS: 100 mL.   INDICATION FOR PROCEDURE: Geoffrey Barrera is a 42 y.o. male with a history of chronic nasal obstruction and frequent recurrent sinusitis. The patient was  Previously treated with antibiotics, antihistamine, decongestant, and steroid nasal spray. However, the patient continued to be symptomatic. On examination, the patient was noted to have bilateral nasal polyps, bilateral inferior turbinate hypertrophy and nasal septal deviation, causing significant nasal obstruction. His CT scan also showed bilateral maxillary sinus diseases. Based on the above findings, the decision was made for the patient to undergo the above-stated procedures. The risks, benefits, alternatives, and details of the procedures were discussed with the patient. Questions were invited and answered. Informed consent was obtained.   DESCRIPTION OF PROCEDURE: The patient was taken to the operating room and placed supine on the operating table. General endotracheal tube anesthesia was administered by the anesthesiologist. The patient was positioned, and prepped and draped in the standard fashion for nasal surgery. Pledgets soaked with Afrin were placed in both nasal cavities for decongestion. The pledgets were subsequently  removed. The above mentioned severe septal deviation was again noted. 1% lidocaine with 1:100,000 epinephrine was injected onto the nasal septum bilaterally. A hemitransfixion incision was made on the left side. The mucosal flap was carefully elevated on the left side. A cartilaginous incision was made 1 cm superior to the caudal margin of the nasal septum. Mucosal flap was also elevated on the right side in the similar fashion. It should be noted that due to the severe septal deviation, the deviated portion of the cartilaginous and bony septum had to be removed in piecemeal fashion. Once the deviated portions were removed, a straight midline septum was achieved. The septum was then quilted with 4-0 plain gut sutures. The hemitransfixion incision was closed with interrupted 4-0 chromic sutures.  The inferior one half of both hypertrophied inferior turbinate was crossclamped with a Kelly clamp. The inferior one half of each inferior turbinate was then resected with a pair of cross cutting scissors. Hemostasis was achieved with a suction cautery device.   Attention was then focused on the maxillary sinuses. Using a 0 scope, the left nasal cavity was examined. The left middle turbinate was carefully medialized. Polypoid tissue was noted within the middle meatus. The polypoid tissue was removed using a combination of Blakesley forceps and microdebrider. The uncinate process was resected with a freer elevator. The maxillary antrum was entered and enlarged using a combination of backbiting forceps and microdebrider. Polypoid tissue was removed from the maxillary sinus. The maxillary sinus was copiously irrigated. The same procedure was repeated on the right side without exception.  The care of the patient was turned over to the anesthesiologist. The patient was awakened from anesthesia without difficulty. The patient was extubated and transferred to the recovery room in good condition.   OPERATIVE FINDINGS:  Severe nasal septal deviation and bilateral inferior turbinate hypertrophy. Bilateral chronic maxillary  sinusitis with polyposis.  SPECIMEN: Bilateral sinus contents.  FOLLOWUP CARE: The patient be discharged home once he is awake and alert. The patient will be placed on Percocet 1 tablets p.o. Q.4 hours p.r.n. pain, and amoxicillin 875 mg p.o. b.i.d. for 5 days. The patient will follow up in my office in approximately 1 week for splint removal.   Geoffrey Beth Raynelle Bring, MD

## 2018-07-06 NOTE — Transfer of Care (Signed)
Immediate Anesthesia Transfer of Care Note  Patient: Geoffrey Barrera  Procedure(s) Performed: NASAL SEPTOPLASTY WITH BILATERAL TURBINATE REDUCTION (Bilateral Nose) ENDOSCOPIC BILATERAL MAXILLARY ANTROSTOMY WITH TISSUE REMOVAL (Bilateral Nose)  Patient Location: PACU  Anesthesia Type:General  Level of Consciousness: awake, sedated and patient cooperative  Airway & Oxygen Therapy: Patient Spontanous Breathing and aerosol face mask  Post-op Assessment: Report given to RN and Post -op Vital signs reviewed and stable  Post vital signs: Reviewed and stable  Last Vitals:  Vitals Value Taken Time  BP    Temp    Pulse    Resp    SpO2      Last Pain:  Vitals:   07/06/18 0847  TempSrc: Oral  PainSc: 0-No pain         Complications: No apparent anesthesia complications

## 2018-07-06 NOTE — Progress Notes (Signed)
Dr. Thomes Cake called to notify that patient has no PACU orders.

## 2018-07-06 NOTE — Anesthesia Postprocedure Evaluation (Signed)
Anesthesia Post Note  Patient: Geoffrey Barrera  Procedure(s) Performed: NASAL SEPTOPLASTY WITH BILATERAL TURBINATE REDUCTION (Bilateral Nose) ENDOSCOPIC BILATERAL MAXILLARY ANTROSTOMY WITH TISSUE REMOVAL (Bilateral Nose)     Patient location during evaluation: PACU Anesthesia Type: General Level of consciousness: awake and alert Pain management: pain level controlled Vital Signs Assessment: post-procedure vital signs reviewed and stable Respiratory status: spontaneous breathing, nonlabored ventilation, respiratory function stable and patient connected to nasal cannula oxygen Cardiovascular status: blood pressure returned to baseline and stable Postop Assessment: no apparent nausea or vomiting Anesthetic complications: no    Last Vitals:  Vitals:   07/06/18 1515 07/06/18 1530  BP: 124/76 123/78  Pulse: 60 61  Resp:    Temp:    SpO2: 94% 97%    Last Pain:  Vitals:   07/06/18 1530  TempSrc:   PainSc: Asleep                 Montez Hageman

## 2018-07-06 NOTE — Progress Notes (Addendum)
Called Dr. Benjamine Mola to notify that Dr. Marcell Barlow thinks it would be best for patient to stay over-night d/t history of sleep apnea with BiPAP use q night and since patient will be unable to use BiPAP tonight d/t surgery. Dr. Benjamine Mola agrees that patient should stay overnight and said that he will place orders for this plan.

## 2018-07-07 ENCOUNTER — Encounter (HOSPITAL_BASED_OUTPATIENT_CLINIC_OR_DEPARTMENT_OTHER): Payer: Self-pay | Admitting: Otolaryngology

## 2018-07-07 DIAGNOSIS — J342 Deviated nasal septum: Secondary | ICD-10-CM | POA: Diagnosis not present

## 2018-07-07 MED ORDER — SUPREP BOWEL PREP KIT 17.5-3.13-1.6 GM/177ML PO SOLN: 1.0000 | Freq: Once | ORAL | 0 refills | Status: DC

## 2018-07-07 NOTE — Progress Notes (Signed)
Talked to Dr. Benjamine Mola, informed patient did well last night. O2 sats 94% and above. Patient had moderate amount bloody drainage and dressing changed. Patient did well on his pain medication. Informed MD patient just discharged.

## 2018-07-09 ENCOUNTER — Ambulatory Visit (INDEPENDENT_AMBULATORY_CARE_PROVIDER_SITE_OTHER): Payer: Commercial Managed Care - PPO | Admitting: Otolaryngology

## 2018-07-09 DIAGNOSIS — J338 Other polyp of sinus: Secondary | ICD-10-CM

## 2018-07-09 DIAGNOSIS — J320 Chronic maxillary sinusitis: Secondary | ICD-10-CM

## 2018-07-10 NOTE — Patient Instructions (Signed)
Geoffrey Barrera  07/10/2018     @PREFPERIOPPHARMACY @   Your procedure is scheduled on  07/24/2018.  Report to [facility] at  [email or web address].  Call this number if you have problems the morning of surgery:  [phone]   Remember:  Do not eat or drink after midnight.  You may drink clear liquids until  (follow the instructions given to you) .  Clear liquids allowed are:                    Water, Juice (non-citric and without pulp), Carbonated beverages, Clear Tea, Black Coffee only, Plain Jell-O only, Gatorade and Plain Popsicles only    Take these medicines the morning of surgery with A SIP OF WATER  Buspar, zyrtec, lisinopril, prilosec, oxycodone, paxil.    Do not wear jewelry, make-up or nail polish.  Do not wear lotions, powders, or perfumes, or deodorant.  Do not shave 48 hours prior to surgery.  Men may shave face and neck.  Do not bring valuables to the hospital.  [facility] is not responsible for any belongings or valuables.  Contacts, dentures or bridgework may not be worn into surgery.  Leave your suitcase in the car.  After surgery it may be brought to your room.  For patients admitted to the hospital, discharge time will be determined by your treatment team.  Patients discharged the day of surgery will not be allowed to drive home.   Name and phone number of your driver:   family Special instructions:  Follow the diet and prep instructions given to you by Dr Olevia Perches office.  Please read over the following fact sheets that you were given. Anesthesia Post-op Instructions and Care and Recovery After Surgery       Esophagogastroduodenoscopy Esophagogastroduodenoscopy (EGD) is a procedure to examine the lining of the esophagus, stomach, and first part of the small intestine (duodenum). This procedure is done to check for problems such as inflammation, bleeding, ulcers, or growths. During this procedure, a long, flexible, lighted tube with a camera  attached (endoscope) is inserted down the throat. Tell a health care provider about:  Any allergies you have.  All medicines you are taking, including vitamins, herbs, eye drops, creams, and over-the-counter medicines.  Any problems you or family members have had with anesthetic medicines.  Any blood disorders you have.  Any surgeries you have had.  Any medical conditions you have.  Whether you are pregnant or may be pregnant. What are the risks? Generally, this is a safe procedure. However, problems may occur, including:  Infection.  Bleeding.  A tear (perforation) in the esophagus, stomach, or duodenum.  Trouble breathing.  Excessive sweating.  Spasms of the larynx.  A slowed heartbeat.  Low blood pressure.  What happens before the procedure?  Follow instructions from your health care provider about eating or drinking restrictions.  Ask your health care provider about: ? Changing or stopping your regular medicines. This is especially important if you are taking diabetes medicines or blood thinners. ? Taking medicines such as aspirin and ibuprofen. These medicines can thin your blood. Do not take these medicines before your procedure if your health care provider instructs you not to.  Plan to have someone take you home after the procedure.  If you wear dentures, be ready to remove them before the procedure. What happens during the procedure?  To reduce your risk of infection,  your health care team will wash or sanitize their hands.  An IV tube will be put in a vein in your hand or arm. You will get medicines and fluids through this tube.  You will be given one or more of the following: ? A medicine to help you relax (sedative). ? A medicine to numb the area (local anesthetic). This medicine may be sprayed into your throat. It will make you feel more comfortable and keep you from gagging or coughing during the procedure. ? A medicine for pain.  A mouth guard  may be placed in your mouth to protect your teeth and to keep you from biting on the endoscope.  You will be asked to lie on your left side.  The endoscope will be lowered down your throat into your esophagus, stomach, and duodenum.  Air will be put into the endoscope. This will help your health care provider see better.  The lining of your esophagus, stomach, and duodenum will be examined.  Your health care provider may: ? Take a tissue sample so it can be looked at in a lab (biopsy). ? Remove growths. ? Remove objects (foreign bodies) that are stuck. ? Treat any bleeding with medicines or other devices that stop tissue from bleeding. ? Widen (dilate) or stretch narrowed areas of your esophagus and stomach.  The endoscope will be taken out. The procedure may vary among health care providers and hospitals. What happens after the procedure?  Your blood pressure, heart rate, breathing rate, and blood oxygen level will be monitored often until the medicines you were given have worn off.  Do not eat or drink anything until the numbing medicine has worn off and your gag reflex has returned. This information is not intended to replace advice given to you by your health care provider. Make sure you discuss any questions you have with your health care provider. Document Released: 03/07/2005 Document Revised: 04/11/2016 Document Reviewed: 09/28/2015 Elsevier Interactive Patient Education  2018 [employer]. Esophagogastroduodenoscopy, Care After Refer to this sheet in the next few weeks. These instructions provide you with information about caring for yourself after your procedure. Your health care provider may also give you more specific instructions. Your treatment has been planned according to current medical practices, but problems sometimes occur. Call your health care provider if you have any problems or questions after your procedure. What can I expect after the procedure? After the  procedure, it is common to have:  A sore throat.  Nausea.  Bloating.  Dizziness.  Fatigue.  Follow these instructions at home:  Do not eat or drink anything until the numbing medicine (local anesthetic) has worn off and your gag reflex has returned. You will know that the local anesthetic has worn off when you can swallow comfortably.  Do not drive for 24 hours if you received a medicine to help you relax (sedative).  If your health care provider took a tissue sample for testing during the procedure, make sure to get your test results. This is your responsibility. Ask your health care provider or the department performing the test when your results will be ready.  Keep all follow-up visits as told by your health care provider. This is important. Contact a health care provider if:  You cannot stop coughing.  You are not urinating.  You are urinating less than usual. Get help right away if:  You have trouble swallowing.  You cannot eat or drink.  You have throat or chest pain that  gets worse.  You are dizzy or light-headed.  You faint.  You have nausea or vomiting.  You have chills.  You have a fever.  You have severe abdominal pain.  You have black, tarry, or bloody stools. This information is not intended to replace advice given to you by your health care provider. Make sure you discuss any questions you have with your health care provider. Document Released: 10/21/2012 Document Revised: 04/11/2016 Document Reviewed: 09/28/2015 Elsevier Interactive Patient Education  2018 [employer].  Colonoscopy, Adult A colonoscopy is an exam to look at the large intestine. It is done to check for problems, such as:  Lumps (tumors).  Growths (polyps).  Swelling (inflammation).  Bleeding.  What happens before the procedure? Eating and drinking Follow instructions from your doctor about eating and drinking. These instructions may include:  A few days before the  procedure - follow a low-fiber diet. ? Avoid nuts. ? Avoid seeds. ? Avoid dried fruit. ? Avoid raw fruits. ? Avoid vegetables.  1-3 days before the procedure - follow a clear liquid diet. Avoid liquids that have red or purple dye. Drink only clear liquids, such as: ? Clear broth or bouillon. ? Black coffee or tea. ? Clear juice. ? Clear soft drinks or sports drinks. ? Gelatin dessert. ? Popsicles.  On the day of the procedure - do not eat or drink anything during the 2 hours before the procedure.  Bowel prep If you were prescribed an oral bowel prep:  Take it as told by your doctor. Starting the day before your procedure, you will need to drink a lot of liquid. The liquid will cause you to poop (have bowel movements) until your poop is almost clear or light green.  If your skin or butt gets irritated from diarrhea, you may: ? Wipe the area with wipes that have medicine in them, such as adult wet wipes with aloe and vitamin E. ? Put something on your skin that soothes the area, such as petroleum jelly.  If you throw up (vomit) while drinking the bowel prep, take a break for up to 60 minutes. Then begin the bowel prep again. If you keep throwing up and you cannot take the bowel prep without throwing up, call your doctor.  General instructions  Ask your doctor about changing or stopping your normal medicines. This is important if you take diabetes medicines or blood thinners.  Plan to have someone take you home from the hospital or clinic. What happens during the procedure?  An IV tube may be put into one of your veins.  You will be given medicine to help you relax (sedative).  To reduce your risk of infection: ? Your doctors will wash their hands. ? Your anal area will be washed with soap.  You will be asked to lie on your side with your knees bent.  Your doctor will get a long, thin, flexible tube ready. The tube will have a camera and a light on the end.  The tube will  be put into your anus.  The tube will be gently put into your large intestine.  Air will be delivered into your large intestine to keep it open. You may feel some pressure or cramping.  The camera will be used to take photos.  A small tissue sample may be removed from your body to be looked at under a microscope (biopsy). If any possible problems are found, the tissue will be sent to a lab for testing.  If  small growths are found, your doctor may remove them and have them checked for cancer.  The tube that was put into your anus will be slowly removed. The procedure may vary among doctors and hospitals. What happens after the procedure?  Your doctor will check on you often until the medicines you were given have worn off.  Do not drive for 24 hours after the procedure.  You may have a small amount of blood in your poop.  You may pass gas.  You may have mild cramps or bloating in your belly (abdomen).  It is up to you to get the results of your procedure. Ask your doctor, or the department performing the procedure, when your results will be ready. This information is not intended to replace advice given to you by your health care provider. Make sure you discuss any questions you have with your health care provider. Document Released: 12/07/2010 Document Revised: 09/04/2016 Document Reviewed: 01/16/2016 Elsevier Interactive Patient Education  2017 [employer]  Colonoscopy, Adult, Care After This sheet gives you information about how to care for yourself after your procedure. Your health care provider may also give you more specific instructions. If you have problems or questions, contact your health care provider. What can I expect after the procedure? After the procedure, it is common to have:  A small amount of blood in your stool for 24 hours after the procedure.  Some gas.  Mild abdominal cramping or bloating.  Follow these instructions at home: General  instructions   For the first 24 hours after the procedure: ? Do not drive or use machinery. ? Do not sign important documents. ? Do not drink alcohol. ? Do your regular daily activities at a slower pace than normal. ? Eat soft, easy-to-digest foods. ? Rest often.  Take over-the-counter or prescription medicines only as told by your health care provider.  It is up to you to get the results of your procedure. Ask your health care provider, or the department performing the procedure, when your results will be ready. Relieving cramping and bloating  Try walking around when you have cramps or feel bloated.  Apply heat to your abdomen as told by your health care provider. Use a heat source that your health care provider recommends, such as a moist heat pack or a heating pad. ? Place a towel between your skin and the heat source. ? Leave the heat on for 20-30 minutes. ? Remove the heat if your skin turns bright red. This is especially important if you are unable to feel pain, heat, or cold. You may have a greater risk of getting burned. Eating and drinking  Drink enough fluid to keep your urine clear or pale yellow.  Resume your normal diet as instructed by your health care provider. Avoid heavy or fried foods that are hard to digest.  Avoid drinking alcohol for as long as instructed by your health care provider. Contact a health care provider if:  You have blood in your stool 2-3 days after the procedure. Get help right away if:  You have more than a small spotting of blood in your stool.  You pass large blood clots in your stool.  Your abdomen is swollen.  You have nausea or vomiting.  You have a fever.  You have increasing abdominal pain that is not relieved with medicine. This information is not intended to replace advice given to you by your health care provider. Make sure you discuss any questions you have  with your health care provider. Document Released: 06/18/2004  Document Revised: 07/29/2016 Document Reviewed: 01/16/2016 Elsevier Interactive Patient Education  2018 [place] Anesthesia is a term that refers to techniques, procedures, and medicines that help a person stay safe and comfortable during a medical procedure. Monitored anesthesia care, or sedation, is one type of anesthesia. Your anesthesia specialist may recommend sedation if you will be having a procedure that does not require you to be unconscious, such as:  Cataract surgery.  A dental procedure.  A biopsy.  A colonoscopy.  During the procedure, you may receive a medicine to help you relax (sedative). There are three levels of sedation:  Mild sedation. At this level, you may feel awake and relaxed. You will be able to follow directions.  Moderate sedation. At this level, you will be sleepy. You may not remember the procedure.  Deep sedation. At this level, you will be asleep. You will not remember the procedure.  The more medicine you are given, the deeper your level of sedation will be. Depending on how you respond to the procedure, the anesthesia specialist may change your level of sedation or the type of anesthesia to fit your needs. An anesthesia specialist will monitor you closely during the procedure. Let your health care provider know about:  Any allergies you have.  All medicines you are taking, including vitamins, herbs, eye drops, creams, and over-the-counter medicines.  Any use of steroids (by mouth or as a cream).  Any problems you or family members have had with sedatives and anesthetic medicines.  Any blood disorders you have.  Any surgeries you have had.  Any medical conditions you have, such as sleep apnea.  Whether you are pregnant or may be pregnant.  Any use of cigarettes, alcohol, or street drugs. What are the risks? Generally, this is a safe procedure. However, problems may occur, including:  Getting too much  medicine (oversedation).  Nausea.  Allergic reaction to medicines.  Trouble breathing. If this happens, a breathing tube may be used to help with breathing. It will be removed when you are awake and breathing on your own.  Heart trouble.  Lung trouble.  Before the procedure Staying hydrated Follow instructions from your health care provider about hydration, which may include:  Up to 2 hours before the procedure - you may continue to drink clear liquids, such as water, clear fruit juice, black coffee, and plain tea.  Eating and drinking restrictions Follow instructions from your health care provider about eating and drinking, which may include:  8 hours before the procedure - stop eating heavy meals or foods such as meat, fried foods, or fatty foods.  6 hours before the procedure - stop eating light meals or foods, such as toast or cereal.  6 hours before the procedure - stop drinking milk or drinks that contain milk.  2 hours before the procedure - stop drinking clear liquids.  Medicines Ask your health care provider about:  Changing or stopping your regular medicines. This is especially important if you are taking diabetes medicines or blood thinners.  Taking medicines such as aspirin and ibuprofen. These medicines can thin your blood. Do not take these medicines before your procedure if your health care provider instructs you not to.  Tests and exams  You will have a physical exam.  You may have blood tests done to show: ? How well your kidneys and liver are working. ? How well your blood can clot.  General  instructions  Plan to have someone take you home from the hospital or clinic.  If you will be going home right after the procedure, plan to have someone with you for 24 hours.  What happens during the procedure?  Your blood pressure, heart rate, breathing, level of pain and overall condition will be monitored.  An IV tube will be inserted into one of your  veins.  Your anesthesia specialist will give you medicines as needed to keep you comfortable during the procedure. This may mean changing the level of sedation.  The procedure will be performed. After the procedure  Your blood pressure, heart rate, breathing rate, and blood oxygen level will be monitored until the medicines you were given have worn off.  Do not drive for 24 hours if you received a sedative.  You may: ? Feel sleepy, clumsy, or nauseous. ? Feel forgetful about what happened after the procedure. ? Have a sore throat if you had a breathing tube during the procedure. ? Vomit. This information is not intended to replace advice given to you by your health care provider. Make sure you discuss any questions you have with your health care provider. Document Released: 07/31/2005 Document Revised: 04/12/2016 Document Reviewed: 02/25/2016 Elsevier Interactive Patient Education  2018 [place], Care After These instructions provide you with information about caring for yourself after your procedure. Your health care provider may also give you more specific instructions. Your treatment has been planned according to current medical practices, but problems sometimes occur. Call your health care provider if you have any problems or questions after your procedure. What can I expect after the procedure? After your procedure, it is common to:  Feel sleepy for several hours.  Feel clumsy and have poor balance for several hours.  Feel forgetful about what happened after the procedure.  Have poor judgment for several hours.  Feel nauseous or vomit.  Have a sore throat if you had a breathing tube during the procedure.  Follow these instructions at home: For at least 24 hours after the procedure:   Do not: ? Participate in activities in which you could fall or become injured. ? Drive. ? Use heavy machinery. ? Drink alcohol. ? Take sleeping pills or  medicines that cause drowsiness. ? Make important decisions or sign legal documents. ? Take care of children on your own.  Rest. Eating and drinking  Follow the diet that is recommended by your health care provider.  If you vomit, drink water, juice, or soup when you can drink without vomiting.  Make sure you have little or no nausea before eating solid foods. General instructions  Have a responsible adult stay with you until you are awake and alert.  Take over-the-counter and prescription medicines only as told by your health care provider.  If you smoke, do not smoke without supervision.  Keep all follow-up visits as told by your health care provider. This is important. Contact a health care provider if:  You keep feeling nauseous or you keep vomiting.  You feel light-headed.  You develop a rash.  You have a fever. Get help right away if:  You have trouble breathing. This information is not intended to replace advice given to you by your health care provider. Make sure you discuss any questions you have with your health care provider. Document Released: 02/25/2016 Document Revised: 06/26/2016 Document Reviewed: 02/25/2016 Elsevier Interactive Patient Education  [employer].

## 2018-07-15 ENCOUNTER — Other Ambulatory Visit: Payer: Self-pay

## 2018-07-15 ENCOUNTER — Encounter (HOSPITAL_COMMUNITY): Payer: Self-pay

## 2018-07-15 ENCOUNTER — Other Ambulatory Visit (INDEPENDENT_AMBULATORY_CARE_PROVIDER_SITE_OTHER): Payer: Self-pay | Admitting: Internal Medicine

## 2018-07-15 ENCOUNTER — Encounter (HOSPITAL_COMMUNITY)
Admission: RE | Admit: 2018-07-15 | Discharge: 2018-07-15 | Disposition: A | Discharge status: Home / Self Care | Payer: Commercial Managed Care - PPO | Source: Ambulatory Visit | Attending: Internal Medicine | Admitting: Internal Medicine

## 2018-07-15 DIAGNOSIS — Z01812 Encounter for preprocedural laboratory examination: Secondary | ICD-10-CM | POA: Diagnosis present

## 2018-07-15 LAB — CBC WITH DIFFERENTIAL/PLATELET
Basophils Absolute: 0 10*3/uL (ref 0.0–0.1)
Basophils Relative: 0 %
EOS ABS: 0.2 10*3/uL (ref 0.0–0.7)
Eosinophils Relative: 2 %
HCT: 41.1 % (ref 39.0–52.0)
Hemoglobin: 13.6 g/dL (ref 13.0–17.0)
LYMPHS ABS: 1.8 10*3/uL (ref 0.7–4.0)
Lymphocytes Relative: 25 %
MCH: 28.2 pg (ref 26.0–34.0)
MCHC: 33.1 g/dL (ref 30.0–36.0)
MCV: 85.3 fL (ref 78.0–100.0)
Monocytes Absolute: 0.5 10*3/uL (ref 0.1–1.0)
Monocytes Relative: 7 %
Neutro Abs: 4.8 10*3/uL (ref 1.7–7.7)
Neutrophils Relative %: 66 %
PLATELETS: 255 10*3/uL (ref 150–400)
RBC: 4.82 MIL/uL (ref 4.22–5.81)
RDW: 13 % (ref 11.5–15.5)
WBC: 7.2 10*3/uL (ref 4.0–10.5)

## 2018-07-15 LAB — BASIC METABOLIC PANEL
Anion gap: 8 (ref 5–15)
BUN: 15 mg/dL (ref 6–20)
CALCIUM: 9.3 mg/dL (ref 8.9–10.3)
CO2: 29 mmol/L (ref 22–32)
CREATININE: 0.85 mg/dL (ref 0.61–1.24)
Chloride: 102 mmol/L (ref 98–111)
GFR calc Af Amer: >60 mL/min (ref >60)
Glucose, Bld: 78 mg/dL (ref 70–99)
POTASSIUM: 3.9 mmol/L (ref 3.5–5.1)
SODIUM: 139 mmol/L (ref 135–145)

## 2018-07-23 ENCOUNTER — Ambulatory Visit (INDEPENDENT_AMBULATORY_CARE_PROVIDER_SITE_OTHER): Payer: Commercial Managed Care - PPO | Admitting: Otolaryngology

## 2018-07-23 DIAGNOSIS — J320 Chronic maxillary sinusitis: Secondary | ICD-10-CM | POA: Diagnosis not present

## 2018-07-24 ENCOUNTER — Encounter (HOSPITAL_COMMUNITY)
Admission: RE | Disposition: A | Discharge status: Home / Self Care | Payer: Self-pay | Source: Ambulatory Visit | Attending: Internal Medicine

## 2018-07-24 ENCOUNTER — Ambulatory Visit (HOSPITAL_COMMUNITY): Payer: Commercial Managed Care - PPO | Admitting: Anesthesiology

## 2018-07-24 ENCOUNTER — Ambulatory Visit (HOSPITAL_COMMUNITY)
Admission: RE | Admit: 2018-07-24 | Discharge: 2018-07-24 | Disposition: A | Discharge status: Home / Self Care | Payer: Commercial Managed Care - PPO | Source: Ambulatory Visit | Attending: Internal Medicine | Admitting: Internal Medicine

## 2018-07-24 ENCOUNTER — Encounter (HOSPITAL_COMMUNITY): Payer: Self-pay

## 2018-07-24 DIAGNOSIS — K228 Other specified diseases of esophagus: Secondary | ICD-10-CM

## 2018-07-24 DIAGNOSIS — K5730 Diverticulosis of large intestine without perforation or abscess without bleeding: Secondary | ICD-10-CM | POA: Insufficient documentation

## 2018-07-24 DIAGNOSIS — I10 Essential (primary) hypertension: Secondary | ICD-10-CM | POA: Diagnosis not present

## 2018-07-24 DIAGNOSIS — K2950 Unspecified chronic gastritis without bleeding: Secondary | ICD-10-CM | POA: Insufficient documentation

## 2018-07-24 DIAGNOSIS — F329 Major depressive disorder, single episode, unspecified: Secondary | ICD-10-CM | POA: Diagnosis not present

## 2018-07-24 DIAGNOSIS — K644 Residual hemorrhoidal skin tags: Secondary | ICD-10-CM | POA: Insufficient documentation

## 2018-07-24 DIAGNOSIS — K921 Melena: Secondary | ICD-10-CM | POA: Insufficient documentation

## 2018-07-24 DIAGNOSIS — G4730 Sleep apnea, unspecified: Secondary | ICD-10-CM | POA: Insufficient documentation

## 2018-07-24 DIAGNOSIS — Z87891 Personal history of nicotine dependence: Secondary | ICD-10-CM | POA: Insufficient documentation

## 2018-07-24 DIAGNOSIS — R1013 Epigastric pain: Secondary | ICD-10-CM | POA: Diagnosis not present

## 2018-07-24 DIAGNOSIS — Z9989 Dependence on other enabling machines and devices: Secondary | ICD-10-CM | POA: Diagnosis not present

## 2018-07-24 DIAGNOSIS — K219 Gastro-esophageal reflux disease without esophagitis: Secondary | ICD-10-CM | POA: Insufficient documentation

## 2018-07-24 DIAGNOSIS — K648 Other hemorrhoids: Secondary | ICD-10-CM | POA: Insufficient documentation

## 2018-07-24 DIAGNOSIS — E7800 Pure hypercholesterolemia, unspecified: Secondary | ICD-10-CM | POA: Insufficient documentation

## 2018-07-24 DIAGNOSIS — Z91040 Latex allergy status: Secondary | ICD-10-CM | POA: Insufficient documentation

## 2018-07-24 DIAGNOSIS — K625 Hemorrhage of anus and rectum: Secondary | ICD-10-CM | POA: Insufficient documentation

## 2018-07-24 DIAGNOSIS — F419 Anxiety disorder, unspecified: Secondary | ICD-10-CM | POA: Insufficient documentation

## 2018-07-24 DIAGNOSIS — K2970 Gastritis, unspecified, without bleeding: Secondary | ICD-10-CM | POA: Diagnosis not present

## 2018-07-24 DIAGNOSIS — K633 Ulcer of intestine: Secondary | ICD-10-CM | POA: Diagnosis not present

## 2018-07-24 DIAGNOSIS — D120 Benign neoplasm of cecum: Secondary | ICD-10-CM | POA: Diagnosis not present

## 2018-07-24 DIAGNOSIS — Z79899 Other long term (current) drug therapy: Secondary | ICD-10-CM | POA: Insufficient documentation

## 2018-07-24 HISTORY — PX: COLONOSCOPY WITH PROPOFOL: SHX5780

## 2018-07-24 HISTORY — PX: POLYPECTOMY: SHX5525

## 2018-07-24 HISTORY — PX: ESOPHAGOGASTRODUODENOSCOPY (EGD) WITH PROPOFOL: SHX5813

## 2018-07-24 HISTORY — PX: BIOPSY: SHX5522

## 2018-07-24 SURGERY — COLONOSCOPY WITH PROPOFOL
Anesthesia: Monitor Anesthesia Care

## 2018-07-24 MED ORDER — FENTANYL CITRATE (PF) 100 MCG/2ML IJ SOLN
INTRAMUSCULAR | Status: DC | PRN
  Administered 2018-07-24: 100 ug via INTRAVENOUS

## 2018-07-24 MED ORDER — CHLORHEXIDINE GLUCONATE CLOTH 2 % EX PADS: 6.0000 | MEDICATED_PAD | Freq: Once | CUTANEOUS | Status: DC

## 2018-07-24 MED ORDER — FENTANYL CITRATE (PF) 100 MCG/2ML IJ SOLN
INTRAMUSCULAR | Status: AC
  Filled 2018-07-24: qty 2

## 2018-07-24 MED ORDER — GLYCOPYRROLATE 0.2 MG/ML IJ SOLN
INTRAMUSCULAR | Status: DC | PRN
  Administered 2018-07-24: 0.2 mg via INTRAVENOUS

## 2018-07-24 MED ORDER — GLYCOPYRROLATE 0.2 MG/ML IJ SOLN
INTRAMUSCULAR | Status: AC
  Filled 2018-07-24: qty 2

## 2018-07-24 MED ORDER — HYDROCODONE-ACETAMINOPHEN 7.5-325 MG PO TABS: 1.0000 | ORAL_TABLET | Freq: Once | ORAL | Status: DC | PRN

## 2018-07-24 MED ORDER — PROPOFOL 10 MG/ML IV BOLUS
INTRAVENOUS | Status: DC | PRN
  Administered 2018-07-24: 40 mg via INTRAVENOUS
  Administered 2018-07-24 (×2): 20 mg via INTRAVENOUS

## 2018-07-24 MED ORDER — PROMETHAZINE HCL 25 MG/ML IJ SOLN: 6.2500 mg | INTRAMUSCULAR | Status: DC | PRN

## 2018-07-24 MED ORDER — HYDROMORPHONE HCL 1 MG/ML IJ SOLN: 0.2500 mg | INTRAMUSCULAR | Status: DC | PRN

## 2018-07-24 MED ORDER — EPHEDRINE SULFATE 50 MG/ML IJ SOLN
INTRAMUSCULAR | Status: DC | PRN
  Administered 2018-07-24: 10 mg via INTRAVENOUS

## 2018-07-24 MED ORDER — LACTATED RINGERS IV SOLN: INTRAVENOUS | Status: DC

## 2018-07-24 MED ORDER — MIDAZOLAM HCL 5 MG/5ML IJ SOLN
INTRAMUSCULAR | Status: DC | PRN
  Administered 2018-07-24: 2 mg via INTRAVENOUS

## 2018-07-24 MED ORDER — PROPOFOL 500 MG/50ML IV EMUL
INTRAVENOUS | Status: DC | PRN
  Administered 2018-07-24: 09:00:00 via INTRAVENOUS
  Administered 2018-07-24: 100 ug/kg/min via INTRAVENOUS

## 2018-07-24 MED ORDER — OMEPRAZOLE 20 MG PO CPDR: 20.0000 mg | DELAYED_RELEASE_CAPSULE | Freq: Every day | ORAL | 5 refills | Status: DC

## 2018-07-24 MED ORDER — MEPERIDINE HCL 100 MG/ML IJ SOLN: 6.2500 mg | INTRAMUSCULAR | Status: DC | PRN

## 2018-07-24 MED ORDER — EPHEDRINE SULFATE 50 MG/ML IJ SOLN
INTRAMUSCULAR | Status: AC
  Filled 2018-07-24: qty 1

## 2018-07-24 MED ORDER — SODIUM CHLORIDE 0.9 % IJ SOLN
INTRAMUSCULAR | Status: AC
  Filled 2018-07-24: qty 10

## 2018-07-24 MED ORDER — PROPOFOL 10 MG/ML IV BOLUS
INTRAVENOUS | Status: AC
  Filled 2018-07-24: qty 60

## 2018-07-24 MED ORDER — LIDOCAINE HCL (PF) 1 % IJ SOLN
INTRAMUSCULAR | Status: AC
  Filled 2018-07-24: qty 10

## 2018-07-24 MED ORDER — LACTATED RINGERS IV SOLN
INTRAVENOUS | Status: DC
  Administered 2018-07-24: 1000 mL via INTRAVENOUS

## 2018-07-24 MED ORDER — MIDAZOLAM HCL 2 MG/2ML IJ SOLN
INTRAMUSCULAR | Status: AC
  Filled 2018-07-24: qty 2

## 2018-07-24 NOTE — Op Note (Signed)
[facility] Patient Name: Geoffrey Barrera Procedure Date: 07/24/2018 9:21 AM MRN: [id number] Date of Birth: [birth year] Attending MD: Hildred Laser , MD CSN: [id number] Age: 42 Admit Type: Outpatient Procedure:                Colonoscopy Indications:              Hematochezia Providers:                Hildred Laser, MD, Gwenlyn Fudge RN, RN, Randa Spike, Technician Referring MD:             Glenda Chroman, MD Medicines:                Propofol per Anesthesia Complications:            No immediate complications. Estimated Blood Loss:     Estimated blood loss was minimal. Procedure:                Pre-Anesthesia Assessment:                           - Prior to the procedure, a History and Physical                            was performed, and patient medications and                            allergies were reviewed. The patient's tolerance of                            previous anesthesia was also reviewed. The risks                            and benefits of the procedure and the sedation                            options and risks were discussed with the patient.                            All questions were answered, and informed consent                            was obtained. Prior Anticoagulants: The patient has                            taken no previous anticoagulant or antiplatelet                            agents. ASA Grade Assessment: II - A patient with                            mild systemic disease. After reviewing the risks  and benefits, the patient was deemed in                            satisfactory condition to undergo the procedure.                           After obtaining informed consent, the colonoscope                            was passed under direct vision. Throughout the                            procedure, the patient's blood pressure, pulse, and                            oxygen saturations  were monitored continuously. The                            [id number]-[id number] ([id number]) scope was introduced through                            the anus and advanced to the the terminal ileum,                            with identification of the appendiceal orifice and                            IC valve. The colonoscopy was performed without                            difficulty. The patient tolerated the procedure                            well. The quality of the bowel preparation was                            good. The terminal ileum, ileocecal valve,                            appendiceal orifice, and rectum were photographed. Scope In: 9:23:31 AM Scope Out: 9:44:09 AM Scope Withdrawal Time: 0 hours 13 minutes 36 seconds  Total Procedure Duration: 0 hours 20 minutes 38 seconds  Findings:      The perianal and digital rectal examinations were normal.      The terminal ileum contained a few non-bleeding erosions. Biopsies were       taken with a cold forceps for histology. The pathology specimen was       placed into Bottle Number 4.      A small polyp was found in the cecum. The polyp was sessile. Biopsies       were taken with a cold forceps for histology. The pathology specimen was       placed into Bottle Number 3.      A few small-mouthed diverticula were found in the sigmoid colon.      External and internal  hemorrhoids were found during retroflexion. The       hemorrhoids were small. Impression:               - A few erosions in the terminal ileum. Biopsied.                           - One small polyp in the cecum. Biopsied.                           - Diverticulosis in the sigmoid colon.                           - External and internal hemorrhoids. Moderate Sedation:      Per Anesthesia Care Recommendation:           - Patient has a contact number available for                            emergencies. The signs and symptoms of potential                            delayed  complications were discussed with the                            patient. Return to normal activities tomorrow.                            Written discharge instructions were provided to the                            patient.                           - High fiber diet today.                           - Continue present medications.                           - No aspirin, ibuprofen, naproxen, or other                            non-steroidal anti-inflammatory drugs for 1 day.                           - Await pathology results.                           - Repeat colonoscopy is recommended. The                            colonoscopy date will be determined after pathology                            results from today's exam become available for  review. Procedure Code(s):        --- Professional ---                           [phone], Colonoscopy, flexible; with biopsy, single                            or multiple Diagnosis Code(s):        --- Professional ---                           K63.3, Ulcer of intestine                           D12.0, Benign neoplasm of cecum                           K64.8, Other hemorrhoids                           K92.1, Melena (includes Hematochezia)                           K57.30, Diverticulosis of large intestine without                            perforation or abscess without bleeding CPT copyright 2017 American Medical Association. All rights reserved. The codes documented in this report are preliminary and upon coder review may  be revised to meet current compliance requirements. Hildred Laser, MD Hildred Laser, MD 07/24/2018 9:57:45 AM This report has been signed electronically. Number of Addenda: 0

## 2018-07-24 NOTE — Addendum Note (Signed)
Addendum  created 07/24/18 0956 by Mickel Baas, CRNA   Charge Capture section accepted

## 2018-07-24 NOTE — Anesthesia Postprocedure Evaluation (Signed)
Anesthesia Post Note  Patient: Geoffrey Barrera  Procedure(s) Performed: COLONOSCOPY WITH PROPOFOL (N/A ) ESOPHAGOGASTRODUODENOSCOPY (EGD) WITH PROPOFOL (N/A ) BIOPSY POLYPECTOMY  Patient location during evaluation: PACU Anesthesia Type: MAC Level of consciousness: awake and alert and oriented Pain management: pain level controlled Vital Signs Assessment: post-procedure vital signs reviewed and stable Respiratory status: spontaneous breathing Cardiovascular status: stable Postop Assessment: no apparent nausea or vomiting Anesthetic complications: no     Last Vitals:  Vitals:   07/24/18 0742  BP: 126/84  Pulse: 64  Temp: 36.7 C  SpO2: 97%    Last Pain:  Vitals:   07/24/18 0900  TempSrc:   PainSc: 0-No pain                 ADAMS, AMY A

## 2018-07-24 NOTE — Anesthesia Preprocedure Evaluation (Signed)
Anesthesia Evaluation  Patient identified by MRN, date of birth, ID band Patient awake    Reviewed: Allergy & Precautions, H&P , NPO status , Patient's Chart, lab work & pertinent test results, reviewed documented beta blocker date and time   Airway Mallampati: II  TM Distance: >3 FB Neck ROM: full    Dental no notable dental hx.    Pulmonary neg pulmonary ROS, sleep apnea , former smoker,    Pulmonary exam normal breath sounds clear to auscultation       Cardiovascular Exercise Tolerance: Good hypertension, On Medications negative cardio ROS   Rhythm:regular Rate:Normal     Neuro/Psych Anxiety Depression negative neurological ROS  negative psych ROS   GI/Hepatic negative GI ROS, Neg liver ROS, GERD  ,  Endo/Other  negative endocrine ROS  Renal/GU negative Renal ROS  negative genitourinary   Musculoskeletal   Abdominal   Peds  Hematology negative hematology ROS (+)   Anesthesia Other Findings BiPap, fully bearded  Reproductive/Obstetrics negative OB ROS                             Anesthesia Physical Anesthesia Plan  ASA: II  Anesthesia Plan: MAC   Post-op Pain Management:    Induction:   PONV Risk Score and Plan:   Airway Management Planned:   Additional Equipment:   Intra-op Plan:   Post-operative Plan:   Informed Consent: I have reviewed the patients History and Physical, chart, labs and discussed the procedure including the risks, benefits and alternatives for the proposed anesthesia with the patient or authorized representative who has indicated his/her understanding and acceptance.   Dental Advisory Given  Plan Discussed with: CRNA and Anesthesiologist  Anesthesia Plan Comments:         Anesthesia Quick Evaluation

## 2018-07-24 NOTE — Discharge Instructions (Signed)
No aspirin or NSAIDs for 24 hours. Resume usual medications as before. High-fiber diet. No driving for 24 hours. Physician will call with biopsy results. Esophagogastroduodenoscopy, Care After Refer to this sheet in the next few weeks. These instructions provide you with information about caring for yourself after your procedure. Your health care provider may also give you more specific instructions. Your treatment has been planned according to current medical practices, but problems sometimes occur. Call your health care provider if you have any problems or questions after your procedure. What can I expect after the procedure? After the procedure, it is common to have:  A sore throat.  Nausea.  Bloating.  Dizziness.  Fatigue.  Follow these instructions at home:  Do not eat or drink anything until the numbing medicine (local anesthetic) has worn off and your gag reflex has returned. You will know that the local anesthetic has worn off when you can swallow comfortably.  Do not drive for 24 hours if you received a medicine to help you relax (sedative).  If your health care provider took a tissue sample for testing during the procedure, make sure to get your test results. This is your responsibility. Ask your health care provider or the department performing the test when your results will be ready.  Keep all follow-up visits as told by your health care provider. This is important. Contact a health care provider if:  You cannot stop coughing.  You are not urinating.  You are urinating less than usual. Get help right away if:  You have trouble swallowing.  You cannot eat or drink.  You have throat or chest pain that gets worse.  You are dizzy or light-headed.  You faint.  You have nausea or vomiting.  You have chills.  You have a fever.  You have severe abdominal pain.  You have black, tarry, or bloody stools. This information is not intended to replace advice  given to you by your health care provider. Make sure you discuss any questions you have with your health care provider. Document Released: 10/21/2012 Document Revised: 04/11/2016 Document Reviewed: 09/28/2015 Elsevier Interactive Patient Education  2018 [employer].  Colonoscopy, Adult, Care After This sheet gives you information about how to care for yourself after your procedure. Your doctor may also give you more specific instructions. If you have problems or questions, call your doctor. Follow these instructions at home: General instructions   For the first 24 hours after the procedure: ? Do not drive or use machinery. ? Do not sign important documents. ? Do not drink alcohol. ? Do your daily activities more slowly than normal. ? Eat foods that are soft and easy to digest. ? Rest often.  Take over-the-counter or prescription medicines only as told by your doctor.  It is up to you to get the results of your procedure. Ask your doctor, or the department performing the procedure, when your results will be ready. To help cramping and bloating:  Try walking around.  Put heat on your belly (abdomen) as told by your doctor. Use a heat source that your doctor recommends, such as a moist heat pack or a heating pad. ? Put a towel between your skin and the heat source. ? Leave the heat on for 20-30 minutes. ? Remove the heat if your skin turns bright red. This is especially important if you cannot feel pain, heat, or cold. You can get burned. Eating and drinking  Drink enough fluid to keep your pee (urine) clear  or pale yellow.  Return to your normal diet as told by your doctor. Avoid heavy or fried foods that are hard to digest.  Avoid drinking alcohol for as long as told by your doctor. Contact a doctor if:  You have blood in your poop (stool) 2-3 days after the procedure. Get help right away if:  You have more than a small amount of blood in your poop.  You see large clumps  of tissue (blood clots) in your poop.  Your belly is swollen.  You feel sick to your stomach (nauseous).  You throw up (vomit).  You have a fever.  You have belly pain that gets worse, and medicine does not help your pain. This information is not intended to replace advice given to you by your health care provider. Make sure you discuss any questions you have with your health care provider. Document Released: 12/07/2010 Document Revised: 07/29/2016 Document Reviewed: 07/29/2016 Elsevier Interactive Patient Education  2017 [employer].

## 2018-07-24 NOTE — H&P (Signed)
Geoffrey Barrera is an 42 y.o. male.   Chief Complaint: Patient is here for EGD and colonoscopy. HPI: Patient is a 42 year old Caucasian male who is here for diagnostic EGD and colonoscopy.  States over the last 2 weeks he had 2 episodes of epigastric pain.  Each lasted for several days.  Some nausea but no vomiting.  He states he had been taking ibuprofen which she has discontinued and he is on PPI primarily for GERD.  He rarely has heartburn while on therapy.  He denies dysphagia. He also gives history of hematochezia which has been going on for the past few years.  He generally passes small to moderate amount of bright red blood.  Always occurs with his bowel movements.  He has 1-2 bowel movements per day.  Most of his stools are formed to hard.  He also has cramping in left lower quadrant which goes away after his bowels move. Family history significant for Crohn's disease in his mother who recently died of complications of fatty liver disease.  Past Medical History:  Diagnosis Date  . Anxiety   . Depression   . Deviated septum   . GERD (gastroesophageal reflux disease)   . High cholesterol   . Hypertension   . Hypertrophy of nasal turbinates   . Sleep apnea    wears BIPAP nightly    Past Surgical History:  Procedure Laterality Date  . NASAL SEPTOPLASTY W/ TURBINOPLASTY Bilateral 07/06/2018   Procedure: NASAL SEPTOPLASTY WITH BILATERAL TURBINATE REDUCTION;  Surgeon: Leta Baptist, MD;  Location: [facility];  Service: ENT;  Laterality: Bilateral;  . NASAL SINUS SURGERY Bilateral 07/06/2018   Procedure: ENDOSCOPIC BILATERAL MAXILLARY ANTROSTOMY WITH TISSUE REMOVAL;  Surgeon: Leta Baptist, MD;  Location: [facility];  Service: ENT;  Laterality: Bilateral;  . WISDOM TOOTH EXTRACTION      Family History  Problem Relation Age of Onset  . Cancer Maternal Grandmother   . Stroke Maternal Grandfather    Social History:  reports that he quit smoking about 8 years ago. His  smoking use included cigarettes. He has a 18.00 pack-year smoking history. He has never used smokeless tobacco. He reports that he drank about 2.0 standard drinks of alcohol per week. He reports that he does not use drugs.  Allergies:  Allergies  Allergen Reactions  . Latex     Medications Prior to Admission  Medication Sig Dispense Refill  . busPIRone (BUSPAR) 15 MG tablet Take 15 mg by mouth as needed.    . cetirizine (ZYRTEC) 10 MG tablet Take 10 mg by mouth daily.    . diphenhydrAMINE (BENADRYL) 25 MG tablet Take 50 mg by mouth daily.    Marland Kitchen lisinopril (PRINIVIL,ZESTRIL) 20 MG tablet Take 20 mg by mouth daily.  4  . Multiple Vitamin (MULTIVITAMIN) capsule Take 1 capsule by mouth daily.    Marland Kitchen omeprazole (PRILOSEC) 20 MG capsule Take 20 mg by mouth daily.    Marland Kitchen oxyCODONE-acetaminophen (PERCOCET) 5-325 MG tablet Take 1 tablet by mouth every 4 (four) hours as needed for severe pain. 20 tablet 0  . PARoxetine (PAXIL) 10 MG tablet Take 10 mg by mouth daily.    . simvastatin (ZOCOR) 20 MG tablet Take 20 mg by mouth daily.  4  . sucralfate (CARAFATE) 1 g tablet Take 1 g by mouth 4 (four) times daily -  with meals and at bedtime.      No results found for this or any previous visit (from the past 48  hour(s)). No results found.  ROS  Blood pressure 126/84, pulse 64, temperature 98.1 F (36.7 C), temperature source Oral, SpO2 97 %. Physical Exam  Constitutional: He appears well-developed and well-nourished.  HENT:  Mouth/Throat: Oropharynx is clear and moist.  Eyes: Conjunctivae are normal. No scleral icterus.  Neck: No thyromegaly present.  Cardiovascular: Normal rate, regular rhythm and normal heart sounds.  No murmur heard. Respiratory: Effort normal and breath sounds normal.  GI:  Abdomen is full.  On palpation is soft.  He has mild tenderness at LLQ.  No organomegaly or masses.  Musculoskeletal: He exhibits no edema.  Lymphadenopathy:    He has no cervical adenopathy.   Neurological: He is alert.  Skin: Skin is warm and dry.     Assessment/Plan Epigastric pain and rectal bleeding. Diagnostic EGD and colonoscopy.  Hildred Laser, MD 07/24/2018, 8:54 AM

## 2018-07-24 NOTE — Transfer of Care (Signed)
Immediate Anesthesia Transfer of Care Note  Patient: Geoffrey Barrera  Procedure(s) Performed: COLONOSCOPY WITH PROPOFOL (N/A ) ESOPHAGOGASTRODUODENOSCOPY (EGD) WITH PROPOFOL (N/A ) BIOPSY POLYPECTOMY  Patient Location: PACU  Anesthesia Type:MAC  Level of Consciousness: awake, alert , oriented and patient cooperative  Airway & Oxygen Therapy: Patient Spontanous Breathing  Post-op Assessment: Report given to RN and Post -op Vital signs reviewed and stable  Post vital signs: Reviewed and stable  Last Vitals:  Vitals Value Taken Time  BP 118/56 07/24/2018  9:50 AM  Temp    Pulse 79 07/24/2018  9:51 AM  Resp 14 07/24/2018  9:51 AM  SpO2 95 % 07/24/2018  9:51 AM  Vitals shown include unvalidated device data.  Last Pain:  Vitals:   07/24/18 0900  TempSrc:   PainSc: 0-No pain         Complications: No apparent anesthesia complications

## 2018-07-24 NOTE — Op Note (Signed)
[facility] Patient Name: Geoffrey Barrera Procedure Date: 07/24/2018 8:38 AM MRN: [id number] Date of Birth: 04/14/1976 Attending MD: Hildred Laser , MD CSN: [id number] Age: 42 Admit Type: Outpatient Procedure:                Upper GI endoscopy Indications:              Epigastric abdominal pain Providers:                Hildred Laser, MD, Otis Peak B. Sharon Seller, RN, Randa Spike, Technician Referring MD:             Glenda Chroman, MD Medicines:                Propofol per Anesthesia Complications:            No immediate complications. Estimated Blood Loss:     Estimated blood loss was minimal. Procedure:                Pre-Anesthesia Assessment:                           - Prior to the procedure, a History and Physical                            was performed, and patient medications and                            allergies were reviewed. The patient's tolerance of                            previous anesthesia was also reviewed. The risks                            and benefits of the procedure and the sedation                            options and risks were discussed with the patient.                            All questions were answered, and informed consent                            was obtained. Prior Anticoagulants: The patient has                            taken no previous anticoagulant or antiplatelet                            agents. ASA Grade Assessment: II - A patient with                            mild systemic disease. After reviewing the risks  and benefits, the patient was deemed in                            satisfactory condition to undergo the procedure.                           After obtaining informed consent, the endoscope was                            passed under direct vision. Throughout the                            procedure, the patient's blood pressure, pulse, and    oxygen saturations were monitored continuously. The                            [id number]-[id number] ([id number]) scope was introduced through the                            and advanced to the second part of duodenum. The                            upper GI endoscopy was accomplished without                            difficulty. The patient tolerated the procedure                            well. Scope In: 9:07:51 AM Scope Out: 9:19:23 AM Total Procedure Duration: 0 hours 11 minutes 32 seconds  Findings:      The examined esophagus was normal.      The Z-line was irregular and was found 40 cm from the incisors. The       pathology specimen was placed into Bottle Number 2.      Localized inflammation characterized by congestion (edema), erosions and       erythema was found in the gastric body. Biopsies were taken with a cold       forceps for histology. The pathology specimen was placed into Bottle       Number 1.      The exam of the stomach was otherwise normal.      The duodenal bulb and second portion of the duodenum were normal. Impression:               - Normal esophagus.                           - Z-line irregular, 40 cm from the incisors.                           - Gastritis. Biopsied.                           - Normal duodenal bulb and second portion of the  duodenum. Moderate Sedation:      Per Anesthesia Care Recommendation:           - Patient has a contact number available for                            emergencies. The signs and symptoms of potential                            delayed complications were discussed with the                            patient. Return to normal activities tomorrow.                            Written discharge instructions were provided to the                            patient.                           - Resume previous diet today.                           - Continue present medications.                           - No  aspirin, ibuprofen, naproxen, or other                            non-steroidal anti-inflammatory drugs for 1 day.                           - Await pathology results. Procedure Code(s):        --- Professional ---                           [phone], Esophagogastroduodenoscopy, flexible,                            transoral; with biopsy, single or multiple Diagnosis Code(s):        --- Professional ---                           K22.8, Other specified diseases of esophagus                           K29.70, Gastritis, unspecified, without bleeding                           R10.13, Epigastric pain CPT copyright 2017 American Medical Association. All rights reserved. The codes documented in this report are preliminary and upon coder review may  be revised to meet current compliance requirements. Hildred Laser, MD Hildred Laser, MD 07/24/2018 9:52:57 AM This report has been signed electronically. Number of Addenda: 0

## 2018-07-29 ENCOUNTER — Encounter (HOSPITAL_COMMUNITY): Payer: Self-pay | Admitting: Internal Medicine

## 2018-09-22 ENCOUNTER — Encounter (INDEPENDENT_AMBULATORY_CARE_PROVIDER_SITE_OTHER): Payer: Self-pay | Admitting: Internal Medicine

## 2018-09-22 ENCOUNTER — Encounter (INDEPENDENT_AMBULATORY_CARE_PROVIDER_SITE_OTHER): Payer: Self-pay | Admitting: *Deleted

## 2018-09-22 ENCOUNTER — Ambulatory Visit (INDEPENDENT_AMBULATORY_CARE_PROVIDER_SITE_OTHER): Payer: Commercial Managed Care - PPO | Admitting: Internal Medicine

## 2018-09-22 VITALS — BP 140/88 | HR 68 | Temp 98.0°F | Resp 18 | Ht 71.0 in | Wt 254.9 lb

## 2018-09-22 DIAGNOSIS — K589 Irritable bowel syndrome without diarrhea: Secondary | ICD-10-CM | POA: Diagnosis not present

## 2018-09-22 DIAGNOSIS — R1013 Epigastric pain: Secondary | ICD-10-CM | POA: Diagnosis not present

## 2018-09-22 MED ORDER — PSYLLIUM 95 % PO PACK: 1.0000 | PACK | Freq: Every day | ORAL | Status: AC

## 2018-09-22 MED ORDER — DICYCLOMINE HCL 10 MG PO CAPS: 10.0000 mg | ORAL_CAPSULE | Freq: Three times a day (TID) | ORAL | 2 refills | Status: DC | PRN

## 2018-09-22 NOTE — Patient Instructions (Addendum)
Physician will call with results of ultrasound when completed. Notify if you have another episode of severe epigastric pain. Weight check in 3 months.

## 2018-09-22 NOTE — Progress Notes (Signed)
Presenting complaint;  Follow-up for epigastric pain and rectal bleeding.  Database and subjective:  Patient is a 42 year old Caucasian male who is here for scheduled visit. He was seen in the office on 07/02/2018 for epigastric pain hematochezia and 30 pound weight loss.  He underwent abdominal pelvic CT on visit to emergency room and no abnormalities were noted.  He had been using ibuprofen.  His H&H was normal at 36.6 and 41.1 on 07/15/2018.  He had normal chemistry panel on 02/19/2018. He underwent EGD and colonoscopy on 07/24/2018. EGD revealed irregular GE junction which was biopsied and erosions in gastric body.  Biopsy from GE junction was negative for Barrett's and gastric biopsy was negative for H. pylori. Colonoscopy revealed few erosions of terminal ileum but biopsy was unremarkable.  Small cecal polyp was a tubular adenoma.  He had sigmoid diverticulosis and external/internal hemorrhoids.  Patient was advised to continue omeprazole and refrain from using NSAIDs. He states he is feeling much better.  He says his appetite has improved.  He has gained 20 pounds since his last visit of 07/02/2018.  He is still having mild nausea which is almost daily and at times he has nausea when he wakes up.  It seemed to get worse after meals.  He says he feels bad after meals and may also noticed some mild epigastric discomfort but nothing like what he was having before.  He admits to being a big eater.  He has noted scant amount of bleeding with his bowel movement.  He just notices a spot or 2 with his bowel movement.  He also complains of intermittent stabbing pain across lower abdomen associated with diarrhea.  He was on Citrucel which was helping but then he stopped it. He states his mother had cholecystectomy for gallstones and his wife is also had surgery for same.  Current Medications: Outpatient Encounter Medications as of 09/22/2018  Medication Sig  . busPIRone (BUSPAR) 15 MG tablet Take 15 mg by  mouth as needed.  . cetirizine (ZYRTEC) 10 MG tablet Take 10 mg by mouth daily.  . diphenhydrAMINE (BENADRYL) 25 MG tablet Take 50 mg by mouth daily.  . fluticasone (FLONASE) 50 MCG/ACT nasal spray Place 2 sprays into both nostrils daily.  Marland Kitchen lisinopril (PRINIVIL,ZESTRIL) 20 MG tablet Take 20 mg by mouth daily.  . Multiple Vitamin (MULTIVITAMIN) capsule Take 1 capsule by mouth daily.  Marland Kitchen omeprazole (PRILOSEC) 20 MG capsule Take 1 capsule (20 mg total) by mouth daily before breakfast.  . PARoxetine (PAXIL) 10 MG tablet Take 10 mg by mouth daily.  . simvastatin (ZOCOR) 20 MG tablet Take 20 mg by mouth daily.  . [DISCONTINUED] oxyCODONE-acetaminophen (PERCOCET) 5-325 MG tablet Take 1 tablet by mouth every 4 (four) hours as needed for severe pain. (Patient not taking: Reported on 09/22/2018)   No facility-administered encounter medications on file as of 09/22/2018.      Objective: Blood pressure 140/88, pulse 68, temperature 98 F (36.7 C), temperature source Oral, resp. rate 18, height 5\' 11"  (1.803 m), weight 254 lb 14.4 oz (115.6 kg). Patient is alert and in no acute distress. Conjunctiva is pink. Sclera is nonicteric Oropharyngeal mucosa is normal. No neck masses or thyromegaly noted. Cardiac exam with regular rhythm normal S1 and S2. No murmur or gallop noted. Lungs are clear to auscultation. Abdomen is full.  Bowel sounds are normal.  On palpation abdomen is soft.  He has mild midepigastric tenderness.  No organomegaly or masses. No LE edema or clubbing noted.  Assessment:  #1.  Epigastric pain.  He is having less pain since been off NSAIDs and with PPI use.  However he is still having postprandial pain and nausea.  Therefore we could be dealing with gallbladder disease.  #2.  Hematochezia.  Hematochezia felt to be second to hemorrhoids.  #3.  Irritable bowel syndrome.  He has typical symptoms of intermittent lower abdominal pain with urgency and diarrhea.  Recent colonoscopy was  negative for colitis.   Plan:  Dicyclomine 10 mg p.o. 3 times daily PRN. Upper abdominal ultrasound. Patient advised to go back on Citrucel and take 4 g daily at bedtime. Patient also counseled on need to improve dietary habits and calorie control. Weight check in 3 months. Office visit in 1 year.

## 2018-09-30 ENCOUNTER — Ambulatory Visit (HOSPITAL_COMMUNITY)
Admission: RE | Admit: 2018-09-30 | Discharge: 2018-09-30 | Disposition: A | Discharge status: Home / Self Care | Payer: Commercial Managed Care - PPO | Source: Ambulatory Visit | Attending: Internal Medicine | Admitting: Internal Medicine

## 2018-09-30 DIAGNOSIS — R1013 Epigastric pain: Secondary | ICD-10-CM | POA: Diagnosis present

## 2018-09-30 DIAGNOSIS — K760 Fatty (change of) liver, not elsewhere classified: Secondary | ICD-10-CM | POA: Insufficient documentation

## 2019-01-21 ENCOUNTER — Ambulatory Visit (INDEPENDENT_AMBULATORY_CARE_PROVIDER_SITE_OTHER): Payer: BC Managed Care – PPO | Admitting: Otolaryngology

## 2019-01-21 DIAGNOSIS — J320 Chronic maxillary sinusitis: Secondary | ICD-10-CM | POA: Diagnosis not present

## 2019-01-21 DIAGNOSIS — J343 Hypertrophy of nasal turbinates: Secondary | ICD-10-CM | POA: Diagnosis not present

## 2019-01-21 DIAGNOSIS — J310 Chronic rhinitis: Secondary | ICD-10-CM | POA: Diagnosis not present

## 2019-02-14 ENCOUNTER — Other Ambulatory Visit (INDEPENDENT_AMBULATORY_CARE_PROVIDER_SITE_OTHER): Payer: Self-pay | Admitting: Internal Medicine

## 2019-07-29 ENCOUNTER — Ambulatory Visit (INDEPENDENT_AMBULATORY_CARE_PROVIDER_SITE_OTHER): Payer: BC Managed Care – PPO | Admitting: Otolaryngology

## 2019-07-29 DIAGNOSIS — J338 Other polyp of sinus: Secondary | ICD-10-CM

## 2019-07-29 DIAGNOSIS — J320 Chronic maxillary sinusitis: Secondary | ICD-10-CM

## 2019-08-01 ENCOUNTER — Other Ambulatory Visit (INDEPENDENT_AMBULATORY_CARE_PROVIDER_SITE_OTHER): Payer: Self-pay | Admitting: Internal Medicine

## 2019-09-28 ENCOUNTER — Ambulatory Visit (INDEPENDENT_AMBULATORY_CARE_PROVIDER_SITE_OTHER): Payer: Commercial Managed Care - PPO | Admitting: Internal Medicine

## 2020-02-08 ENCOUNTER — Other Ambulatory Visit (INDEPENDENT_AMBULATORY_CARE_PROVIDER_SITE_OTHER): Payer: Self-pay | Admitting: Internal Medicine

## 2020-07-18 ENCOUNTER — Emergency Department (HOSPITAL_COMMUNITY): Payer: BC Managed Care – PPO

## 2020-07-18 ENCOUNTER — Emergency Department (HOSPITAL_COMMUNITY)
Admission: EM | Admit: 2020-07-18 | Discharge: 2020-07-18 | Disposition: A | Discharge status: Home / Self Care | Payer: BC Managed Care – PPO | Attending: Emergency Medicine | Admitting: Emergency Medicine

## 2020-07-18 ENCOUNTER — Other Ambulatory Visit: Payer: Self-pay

## 2020-07-18 ENCOUNTER — Encounter (HOSPITAL_COMMUNITY): Payer: Self-pay | Admitting: *Deleted

## 2020-07-18 DIAGNOSIS — I10 Essential (primary) hypertension: Secondary | ICD-10-CM | POA: Diagnosis not present

## 2020-07-18 DIAGNOSIS — U071 COVID-19: Secondary | ICD-10-CM | POA: Insufficient documentation

## 2020-07-18 DIAGNOSIS — J1282 Pneumonia due to coronavirus disease 2019: Secondary | ICD-10-CM | POA: Insufficient documentation

## 2020-07-18 DIAGNOSIS — Z87891 Personal history of nicotine dependence: Secondary | ICD-10-CM | POA: Diagnosis not present

## 2020-07-18 DIAGNOSIS — Z79899 Other long term (current) drug therapy: Secondary | ICD-10-CM | POA: Insufficient documentation

## 2020-07-18 DIAGNOSIS — R042 Hemoptysis: Secondary | ICD-10-CM

## 2020-07-18 DIAGNOSIS — Z91040 Latex allergy status: Secondary | ICD-10-CM | POA: Insufficient documentation

## 2020-07-18 LAB — BASIC METABOLIC PANEL
Anion gap: 13 (ref 5–15)
BUN: 14 mg/dL (ref 6–20)
CO2: 26 mmol/L (ref 22–32)
Calcium: 8.7 mg/dL — ABNORMAL LOW (ref 8.9–10.3)
Chloride: 99 mmol/L (ref 98–111)
Creatinine, Ser: 0.85 mg/dL (ref 0.61–1.24)
GFR calc Af Amer: >60 mL/min (ref >60)
GFR calc non Af Amer: >60 mL/min (ref >60)
Glucose, Bld: 91 mg/dL (ref 70–99)
Potassium: 4.4 mmol/L (ref 3.5–5.1)
Sodium: 138 mmol/L (ref 135–145)

## 2020-07-18 LAB — CBC WITH DIFFERENTIAL/PLATELET
Abs Immature Granulocytes: 0.04 10*3/uL (ref 0.00–0.07)
Basophils Absolute: 0 10*3/uL (ref 0.0–0.1)
Basophils Relative: 1 %
Eosinophils Absolute: 0 10*3/uL (ref 0.0–0.5)
Eosinophils Relative: 0 %
HCT: 46.4 % (ref 39.0–52.0)
Hemoglobin: 15.3 g/dL (ref 13.0–17.0)
Immature Granulocytes: 1 %
Lymphocytes Relative: 16 %
Lymphs Abs: 0.7 10*3/uL (ref 0.7–4.0)
MCH: 28 pg (ref 26.0–34.0)
MCHC: 33 g/dL (ref 30.0–36.0)
MCV: 85 fL (ref 80.0–100.0)
Monocytes Absolute: 0.3 10*3/uL (ref 0.1–1.0)
Monocytes Relative: 7 %
Neutro Abs: 3.3 10*3/uL (ref 1.7–7.7)
Neutrophils Relative %: 75 %
Platelets: 214 10*3/uL (ref 150–400)
RBC: 5.46 MIL/uL (ref 4.22–5.81)
RDW: 13.6 % (ref 11.5–15.5)
WBC: 4.3 10*3/uL (ref 4.0–10.5)
nRBC: 0 % (ref 0.0–0.2)

## 2020-07-18 LAB — SARS CORONAVIRUS 2 BY RT PCR (HOSPITAL ORDER, PERFORMED IN ~~LOC~~ HOSPITAL LAB): SARS Coronavirus 2: POSITIVE — AB

## 2020-07-18 MED ORDER — DOXYCYCLINE HYCLATE 100 MG PO CAPS: 100.0000 mg | ORAL_CAPSULE | Freq: Two times a day (BID) | ORAL | 0 refills | Status: AC

## 2020-07-18 MED ORDER — IOHEXOL 350 MG/ML SOLN
100.0000 mL | Freq: Once | INTRAVENOUS | Status: AC | PRN
  Administered 2020-07-18: 100 mL via INTRAVENOUS

## 2020-07-18 NOTE — Discharge Instructions (Addendum)
Your CT scan shows Covid Pnuemonia.  This is due to the virus, but I am going to treat you for a potential bacterial infection. Return immediately if you become suddenly of breath, If you pass out, if you have a fever that is not controlled with medications, if you have low oxygen saturations or you cough up a massive amount of blood.

## 2020-07-18 NOTE — ED Provider Notes (Signed)
Provider Note   CSN: [id number] Arrival date & time: 07/18/20  1512     History Chief Complaint  Patient presents with  . Covid    Geoffrey Barrera is a 44 y.o. male who presents for evaluation of hemoptysis. The patient began having sxs of covid 19 13 days ago and was recently diagnosed with it. He states that he has had sxs of myalgias, poor appetite, weakness, fatigue and malaise. He has had chest heaviness and sob with cough but has had no hypoxia on home monitor. Today the patient began coughing up large blood clots and was told to come to the ED by PCP for further evaluation. He denies any worsening in his sxs.   HPI     Past Medical History:  Diagnosis Date  . Anxiety   . Depression   . Deviated septum   . GERD (gastroesophageal reflux disease)   . High cholesterol   . Hypertension   . Hypertrophy of nasal turbinates   . Sleep apnea    wears BIPAP nightly    Patient Active Problem List   Diagnosis Date Noted  . S/P nasal septoplasty 07/06/2018  . Abdominal pain, epigastric 07/03/2018  . Rectal bleeding 07/03/2018    Past Surgical History:  Procedure Laterality Date  . BIOPSY  07/24/2018   Procedure: BIOPSY;  Surgeon: Rogene Houston, MD;  [facility];  Service: Endoscopy;;  gastric terminal ileum  . COLONOSCOPY WITH PROPOFOL N/A 07/24/2018   Procedure: COLONOSCOPY WITH PROPOFOL;  Surgeon: Rogene Houston, MD;  [facility]: [facility];  Service: Endoscopy;  Laterality: N/A;  9:10  . ESOPHAGOGASTRODUODENOSCOPY (EGD) WITH PROPOFOL N/A 07/24/2018   Procedure: ESOPHAGOGASTRODUODENOSCOPY (EGD) WITH PROPOFOL;  Surgeon: Rogene Houston, MD;  [facility]: [facility];  Service: Endoscopy;  Laterality: N/A;  . NASAL SEPTOPLASTY W/ TURBINOPLASTY Bilateral 07/06/2018   Procedure: NASAL SEPTOPLASTY WITH BILATERAL TURBINATE REDUCTION;  Surgeon: Leta Baptist, MD;  Location: [place];  Service: ENT;  Laterality:  Bilateral;  . NASAL SINUS SURGERY Bilateral 07/06/2018   Procedure: ENDOSCOPIC BILATERAL MAXILLARY ANTROSTOMY WITH TISSUE REMOVAL;  Surgeon: Leta Baptist, MD;  Location: [place];  Service: ENT;  Laterality: Bilateral;  . POLYPECTOMY  07/24/2018   Procedure: POLYPECTOMY;  Surgeon: Rogene Houston, MD;  [facility]: [facility];  Service: Endoscopy;;  colon  . WISDOM TOOTH EXTRACTION         Family History  Problem Relation Age of Onset  . Cancer Maternal Grandmother   . Stroke Maternal Grandfather     Social History   Tobacco Use  . Smoking status: Former Smoker    Packs/day: 1.00    Years: 18.00    Pack years: 18.00    Types: Cigarettes    Quit date: 07/15/2010    Years since quitting: 10.0  . Smokeless tobacco: Never Used  Vaping Use  . Vaping Use: Never used  Substance Use Topics  . Alcohol use: Not Currently    Alcohol/week: 2.0 standard drinks    Types: 2 Standard drinks or equivalent per week    Comment: none in 2-3 mos  . Drug use: Never    Home Medications Prior to Admission medications   Medication Sig Start Date End Date Taking? Authorizing Provider  acetaminophen (TYLENOL) 500 MG tablet Take 500 mg by mouth every 6 (six) hours as needed for mild pain or moderate pain.   Yes [provider]  azithromycin (ZITHROMAX) 250 MG  tablet Take 250-500 mg by mouth See admin instructions. Take as directed per package instructions. Take two tablets on day 1, then take one tablet on days 2 through 5 starting on 07/17/2020 07/17/20  Yes [provider]  cetirizine (ZYRTEC) 10 MG tablet Take 10 mg by mouth daily.   Yes [provider]  fluticasone (FLONASE) 50 MCG/ACT nasal spray Place 2 sprays into both nostrils daily.   Yes [provider]  ibuprofen (ADVIL) 200 MG tablet Take 200 mg by mouth every 6 (six) hours as needed for fever, headache or mild pain.   Yes [provider]  lisinopril (PRINIVIL,ZESTRIL) 20 MG tablet  Take 20 mg by mouth every evening.  03/19/18  Yes [provider]  Multiple Vitamin (MULTIVITAMIN ADULT PO) Take 1 Package by mouth daily.   Yes [provider]  omeprazole (PRILOSEC) 20 MG capsule TAKE ONE CAPSULE BY MOUTH DAILY BEFORE BREAKFAST Patient taking differently: Take 20 mg by mouth daily as needed (for acid reflux (GERD)).  02/08/20  Yes Rehman, Mechele Dawley, MD  PARoxetine (PAXIL) 10 MG tablet Take 10 mg by mouth every evening.    Yes [provider]  predniSONE (STERAPRED UNI-PAK 21 TAB) 5 MG (21) TBPK tablet Take 5 mg by mouth See admin instructions. Starting on 07/17/2020 take as directed per package instructions for 6 days (6,5,4,3,2,1) 07/17/20  Yes [provider]  simvastatin (ZOCOR) 20 MG tablet Take 20 mg by mouth every evening.  03/19/18  Yes [provider]  doxycycline (VIBRAMYCIN) 100 MG capsule Take 1 capsule (100 mg total) by mouth 2 (two) times daily. One po bid x 7 days 07/18/20   Margarita Mail, PA-C    Allergies    Latex  Review of Systems   Review of Systems  Ten systems reviewed and are negative for acute change, except as noted in the HPI.   Physical Exam Updated Vital Signs BP 140/88 (BP Location: Right Arm)   Pulse 79   Temp (!) 89.9 F (32.2 C) (Oral)   Resp 18   Ht 5\' 11"  (1.803 m)   Wt 113.4 kg   SpO2 98%   BMI 34.87 kg/m   Physical Exam Vitals and nursing note reviewed.  Constitutional:      General: He is not in acute distress.    Appearance: He is well-developed. He is not diaphoretic.  HENT:     Head: Normocephalic and atraumatic.  Eyes:     General: No scleral icterus.    Conjunctiva/sclera: Conjunctivae normal.  Cardiovascular:     Rate and Rhythm: Normal rate and regular rhythm.     Heart sounds: Normal heart sounds.  Pulmonary:     Effort: Pulmonary effort is normal. No respiratory distress.     Breath sounds: Normal breath sounds.  Abdominal:     Palpations: Abdomen is soft.      Tenderness: There is no abdominal tenderness.  Musculoskeletal:     Cervical back: Normal range of motion and neck supple.  Skin:    General: Skin is warm and dry.  Neurological:     Mental Status: He is alert.  Psychiatric:        Behavior: Behavior normal.     ED Results / Procedures / Treatments   Labs (all labs ordered are listed, but only abnormal results are displayed) Labs Reviewed  SARS CORONAVIRUS 2 BY RT PCR (HOSPITAL ORDER, [place] [facility]) - Abnormal; Notable for the following components:  Result Value   SARS Coronavirus 2 POSITIVE (*)    All other components within normal limits  BASIC METABOLIC PANEL - Abnormal; Notable for the following components:   Calcium 8.7 (*)    All other components within normal limits  CBC WITH DIFFERENTIAL/PLATELET    EKG None  Radiology CT Angio Chest PE W and/or Wo Contrast  Result Date: 07/18/2020 CLINICAL DATA:  Hemoptysis for 1 week, COVID-19 positive 07/10/2020 EXAM: CT ANGIOGRAPHY CHEST WITH CONTRAST TECHNIQUE: Multidetector CT imaging of the chest was performed using the standard protocol during bolus administration of intravenous contrast. Multiplanar CT image reconstructions and MIPs were obtained to evaluate the vascular anatomy. CONTRAST:  151mL OMNIPAQUE IOHEXOL 350 MG/ML SOLN COMPARISON:  07/18/2020 FINDINGS: Cardiovascular: This is a technically adequate evaluation of the pulmonary vasculature. There are no filling defects or pulmonary emboli. The heart and great vessels are unremarkable without significant pericardial effusion. Normal caliber of the thoracic aorta. Mediastinum/Nodes: Borderline enlarged lymph nodes are seen within the bilateral hilar regions, measuring up to 11 mm on the right and 9 mm on the left. Subcarinal lymph node measures up to 13 mm. These are likely reactive. The trachea, esophagus, and thyroid are unremarkable. Lungs/Pleura: Multifocal bilateral ground-glass airspace disease  is identified, corresponding to chest x-ray findings and consistent with COVID-19 pneumonia. No effusion or pneumothorax. The central airways are patent. Upper Abdomen: There is diffuse hepatic steatosis. Partially visualized cyst within the upper pole left kidney. No acute upper abdominal findings. Musculoskeletal: No acute or destructive bony lesions. Reconstructed images demonstrate no additional findings. Review of the MIP images confirms the above findings. IMPRESSION: 1. No evidence of pulmonary embolus. 2. Multifocal bilateral pneumonia compatible with COVID 19. 3. Reactive mediastinal and hilar lymph nodes. 4. Hepatic steatosis. Electronically Signed   By: Randa Ngo M.D.   On: 07/18/2020 19:51   DG Chest Port 1 View  Result Date: 07/18/2020 CLINICAL DATA:  Recent diagnosis of COVID positive with hemoptysis. EXAM: PORTABLE CHEST 1 VIEW COMPARISON:  None. FINDINGS: Numerous small mild to moderate severity diffuse bilateral multifocal infiltrates are seen. This is slightly more prominent within the bilateral lung bases. There is no evidence of a pleural effusion or pneumothorax. The heart size and mediastinal contours are within normal limits. The visualized skeletal structures are unremarkable. IMPRESSION: Numerous small to moderate severity diffuse bilateral multifocal infiltrates. Follow-up to resolution is recommended to exclude the presence of an underlying neoplastic process. Electronically Signed   By: Virgina Norfolk M.D.   On: 07/18/2020 18:47    Procedures Procedures (including critical care time)  Medications Ordered in ED Medications  iohexol (OMNIPAQUE) 350 MG/ML injection 100 mL (100 mLs Intravenous Contrast Given 07/18/20 1935)    ED Course  I have reviewed the triage vital signs and the nursing notes.  Pertinent labs & imaging results that were available during my care of the patient were reviewed by me and considered in my medical decision making (see chart for  details).    MDM Rules/Calculators/A&P                          YO:[id number]   VS: BP 140/88 (BP Location: Right Arm)   Pulse 79   Temp (!) 89.9 F (32.2 C) (Oral)   Resp 18   Ht 5\' 11"  (1.803 m)   Wt 113.4 kg   SpO2 98%   BMI 34.87 kg/m   XA:JOINOMV is gathered by patient and emr. Previous records  obtained and reviewed. DDX:The patient's complaint of hemoptysis involves an extensive number of diagnostic and treatment options, and is a complaint that carries with it a high risk of complications, morbidity, and potential mortality. Given the large differential diagnosis, medical decision making is of high complexity. Differential diagnosis of hemoptysis includes, but is not limited to: bronchitis, bronchiectasis, bronchogenic carcinoma, pulmonary vasculature disease, left ventricular failure, mitral stenosis, pulmonary embolism, AV malformation, diseases of the pulmonary parenchyma including pneumonia, abscess, tuberculosis, Aspergilloma parasitic infection, Goodpasture's or Wegener's granulomatosis, SLE, crack cocaine inhalation, leukemia, anticoagulation or acute cough.   Labs: I ordered reviewed and interpreted labs which include CBC without elevated white blood cell count, BMP with mild hypocalcemia of insignificant value, Covid test is positive Imaging: I ordered and reviewed images which included portable 1 view chest x-ray and CT angiogram of the chest. I independently visualized and interpreted all imaging. Significant findings include bilateral multifocal patchy groundglass opacities consistent with multi lobar pneumonia, no evidence of pulmonary embolus. EKG: Consults: MDM: Patient here with complaint of hemoptysis.  No evidence of pulmonary embolus on his chest CT scan.  Findings consistent with multifocal pneumonia.  Patient will be treated for potential secondary bacterial pneumonia with doxycycline.  Discussed outpatient supportive care and return precautions.  Patient  temperature documented at 89.9 degrees.  This was entered in error.  He is euthermic. Patient disposition: Discharge The patient appears reasonably screened and/or stabilized for discharge and I doubt any other medical condition or other [facility] requiring further screening, evaluation, or treatment in the ED at this time prior to discharge. I have discussed lab and/or imaging findings with the patient and answered all questions/concerns to the best of my ability.I have discussed return precautions and OP follow up.    Final Clinical Impression(s) / ED Diagnoses Final diagnoses:  Pneumonia due to COVID-19 virus  Hemoptysis    Rx / DC Orders ED Discharge Orders         Ordered    doxycycline (VIBRAMYCIN) 100 MG capsule  2 times daily        07/18/20 2014           Margarita Mail, PA-C 07/19/20 1208    Milton Ferguson, MD 07/20/20 [phone]

## 2020-07-18 NOTE — ED Triage Notes (Signed)
Pt coughing up blood since this morning.  Dx'd with Covid last Wednesday.  Last took tylenol 4-5 hours ago and last ibuprofen early this morning.

## 2020-08-09 ENCOUNTER — Other Ambulatory Visit (INDEPENDENT_AMBULATORY_CARE_PROVIDER_SITE_OTHER): Payer: Self-pay | Admitting: Internal Medicine

## 2021-02-12 ENCOUNTER — Other Ambulatory Visit (INDEPENDENT_AMBULATORY_CARE_PROVIDER_SITE_OTHER): Payer: Self-pay | Admitting: Internal Medicine

## 2021-02-12 NOTE — Telephone Encounter (Signed)
Patient has not been seen since 2019, will refill for one month. needs follow up appointment with any provider in order to receive any refills.  Thanks,  Maylon Peppers, MD Gastroenterology and Hepatology [facility]

## 2021-03-09 ENCOUNTER — Other Ambulatory Visit (INDEPENDENT_AMBULATORY_CARE_PROVIDER_SITE_OTHER): Payer: Self-pay | Admitting: Gastroenterology

## 2022-04-27 IMAGING — CT CT ANGIO CHEST
2 of 6 series · 18 of 46 positions shown · IV contrast (Omnipaque or Isovue)
Comparison: 07/18/2020

CLINICAL DATA: Hemoptysis for 1 week, 3WFC0-MC positive 07/10/2020

EXAM:
CT ANGIOGRAPHY CHEST WITH CONTRAST
TECHNIQUE: Multidetector CT imaging of the chest was performed using the
standard protocol during bolus administration of intravenous
contrast. Multiplanar CT image reconstructions and MIPs were
obtained to evaluate the vascular anatomy.
CONTRAST:  100mL OMNIPAQUE IOHEXOL 350 MG/ML SOLN

[Series 5: pe axial thins · axial · 0.74mm/px · z∈[+1070,+1368]mm · 15 of 328 slices shown]
[im 15/328  lung]
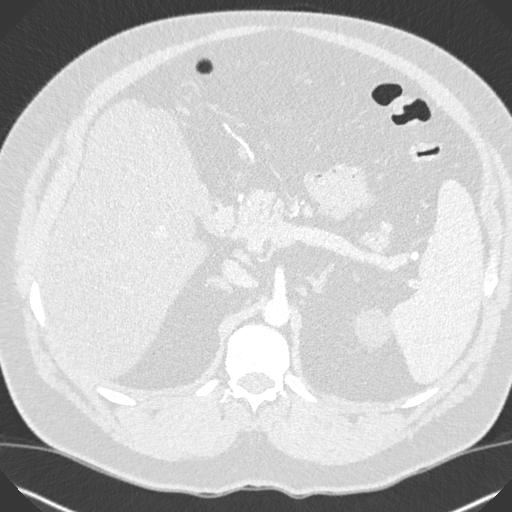
[im 43/328  soft-tissue]
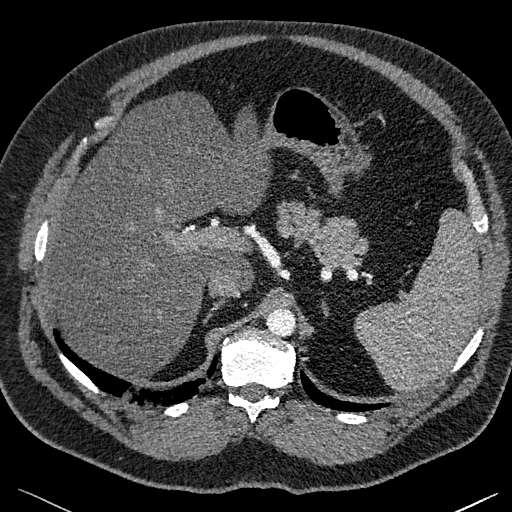
[im 57/328  lung]
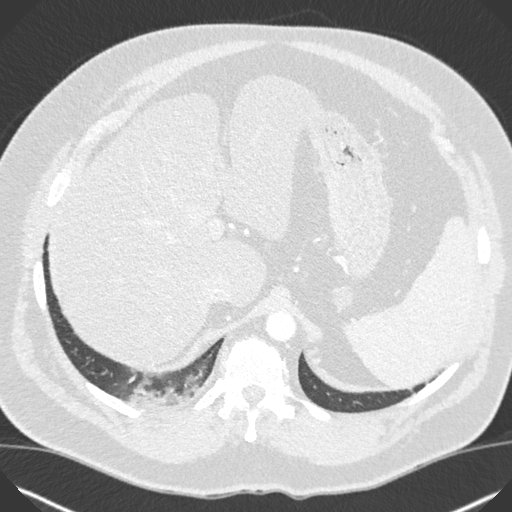
[im 86/328  soft-tissue]
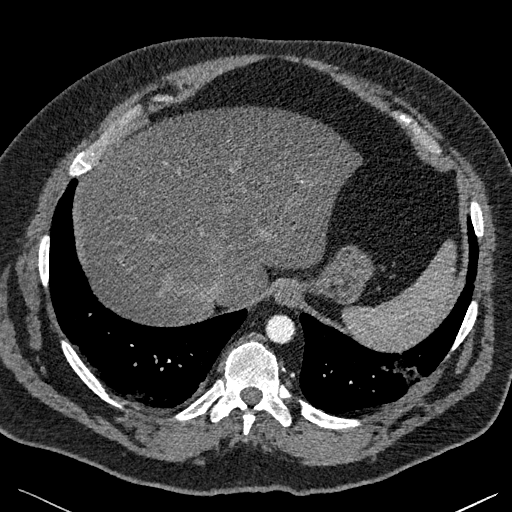
[im 100/328  lung]
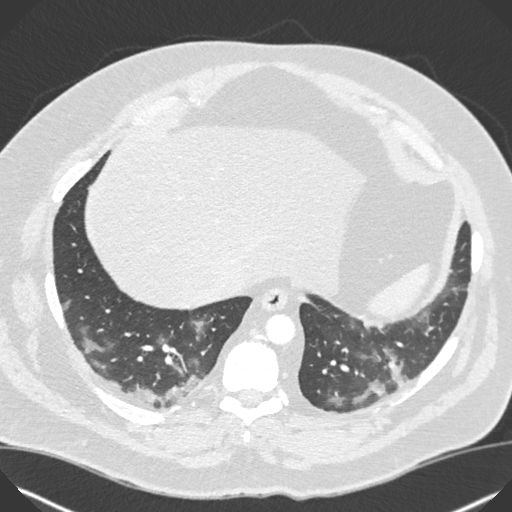
[im 128/328  soft-tissue]
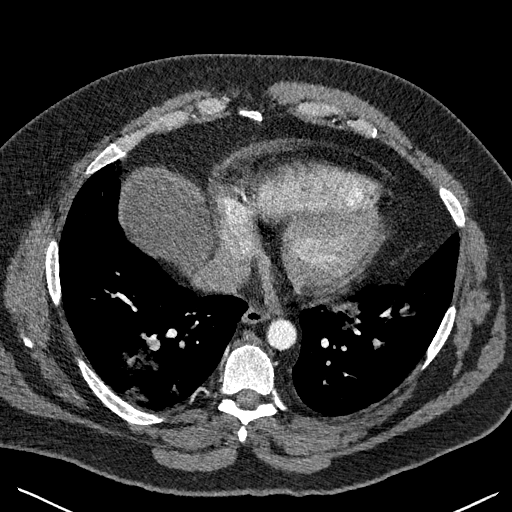
[im 143/328  lung]
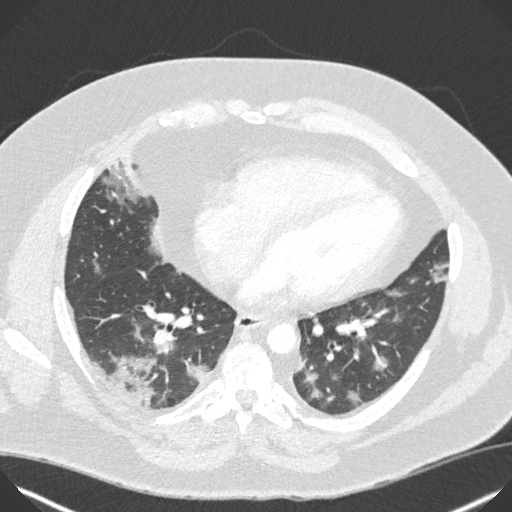
[im 171/328  soft-tissue]
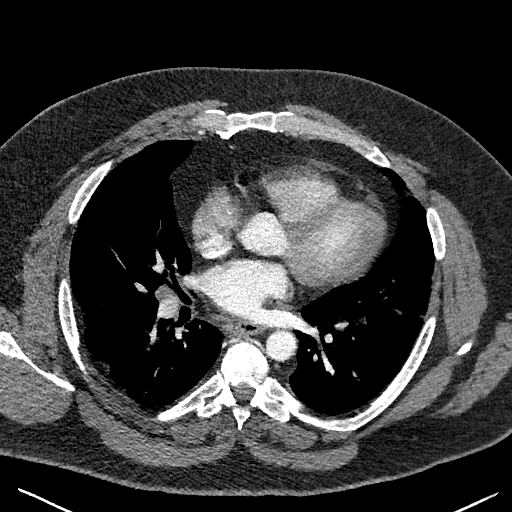
[im 185/328  lung]
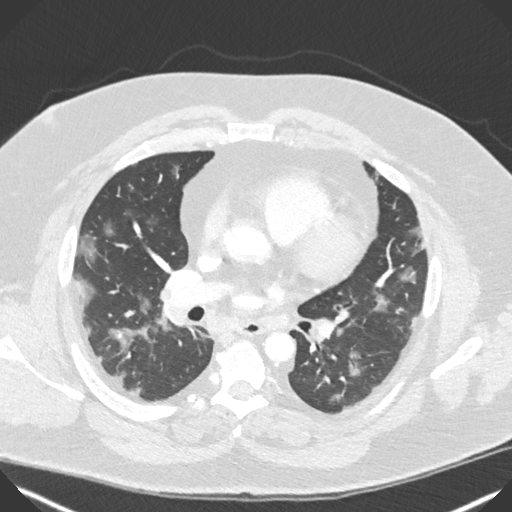
[im 200/328  soft-tissue]
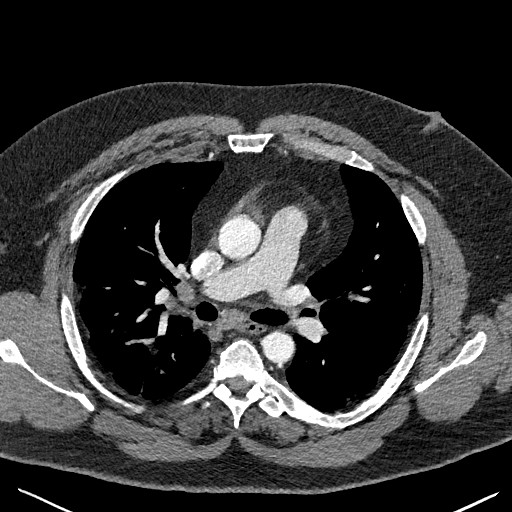
[im 228/328  lung]
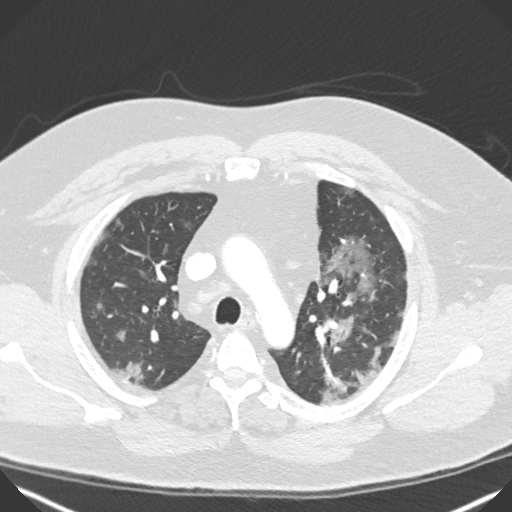
[im 242/328  soft-tissue]
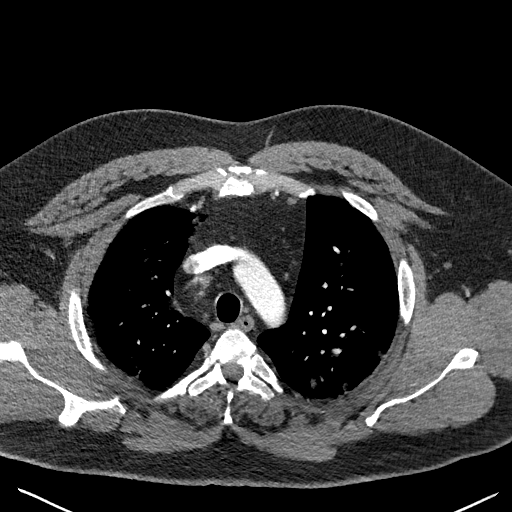
[im 271/328  lung]
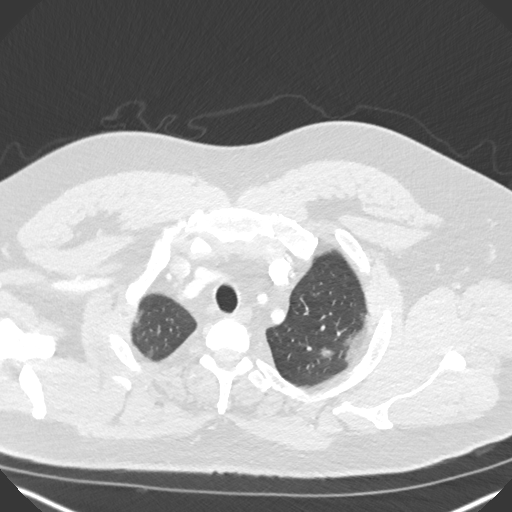
[im 285/328  soft-tissue]
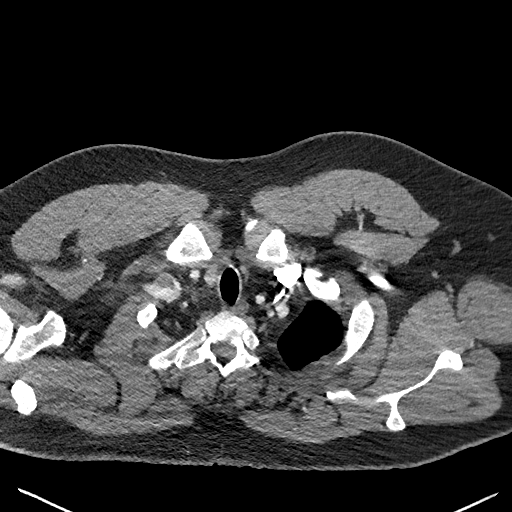
[im 313/328  lung]
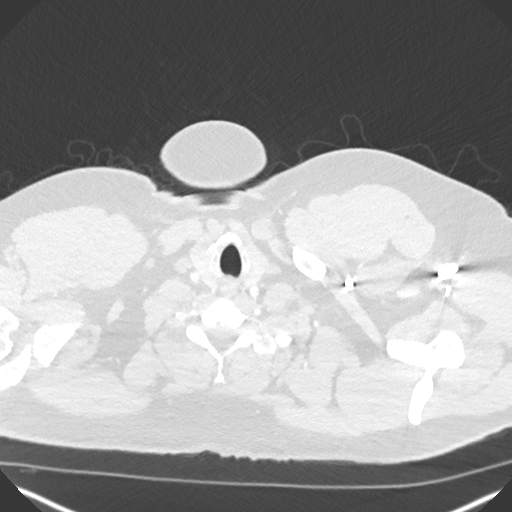

[Series 7: cor soft · coronal · 0.64mm/px · 3 of 151 slices shown]
[im 38/151  soft-tissue]
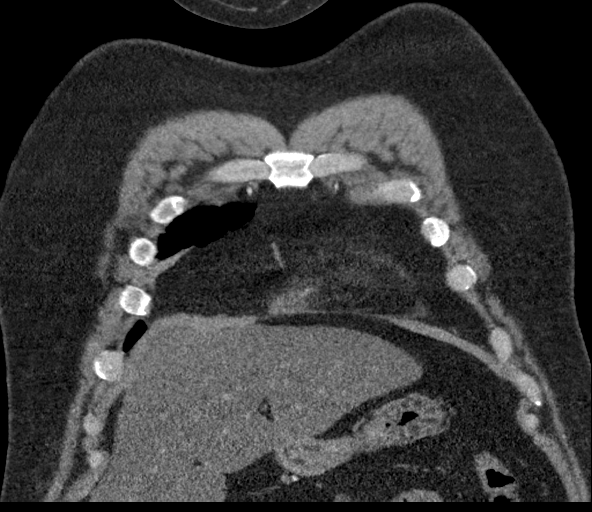
[im 76/151  soft-tissue]
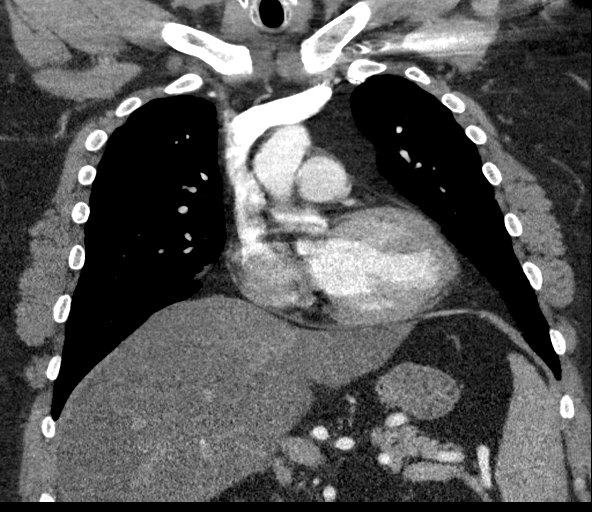
[im 113/151  soft-tissue]
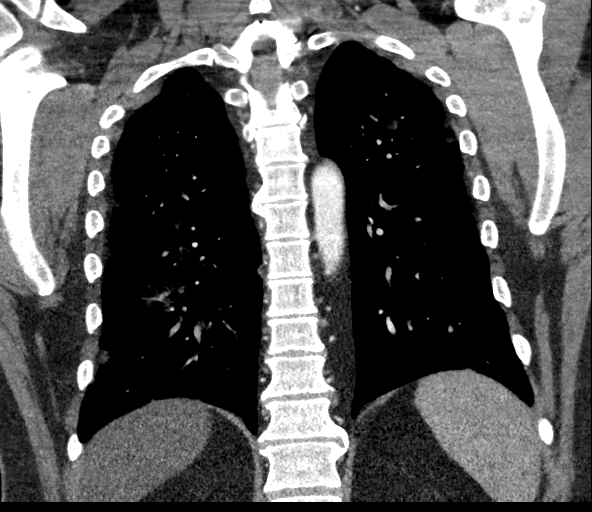

[18 of 46 positions shown; findings below may reference images not displayed]

FINDINGS: Cardiovascular: This is a technically adequate evaluation of the
pulmonary vasculature. There are no filling defects or pulmonary
emboli.

The heart and great vessels are unremarkable without significant
pericardial effusion. Normal caliber of the thoracic aorta.

Mediastinum/Nodes: Borderline enlarged lymph nodes are seen within
the bilateral hilar regions, measuring up to 11 mm on the right and
9 mm on the left. Subcarinal lymph node measures up to 13 mm. These
are likely reactive.

The trachea, esophagus, and thyroid are unremarkable.

Lungs/Pleura: Multifocal bilateral ground-glass airspace disease is
identified, corresponding to chest x-ray findings and consistent
with 3WFC0-MC pneumonia. No effusion or pneumothorax. The central
airways are patent.

Upper Abdomen: There is diffuse hepatic steatosis. Partially
visualized cyst within the upper pole left kidney. No acute upper
abdominal findings.

Musculoskeletal: No acute or destructive bony lesions. Reconstructed
images demonstrate no additional findings.

Review of the MIP images confirms the above findings.
IMPRESSION: 1. No evidence of pulmonary embolus.
2. Multifocal bilateral pneumonia compatible with COVID 19.
3. Reactive mediastinal and hilar lymph nodes.
4. Hepatic steatosis.

## 2022-04-27 IMAGING — DX DG CHEST 1V PORT
1 series · 1 of 1 positions shown · non-contrast
Comparison: None.

CLINICAL DATA: Recent diagnosis of COVID positive with hemoptysis.

EXAM:
PORTABLE CHEST 1 VIEW

[chest ap grid]
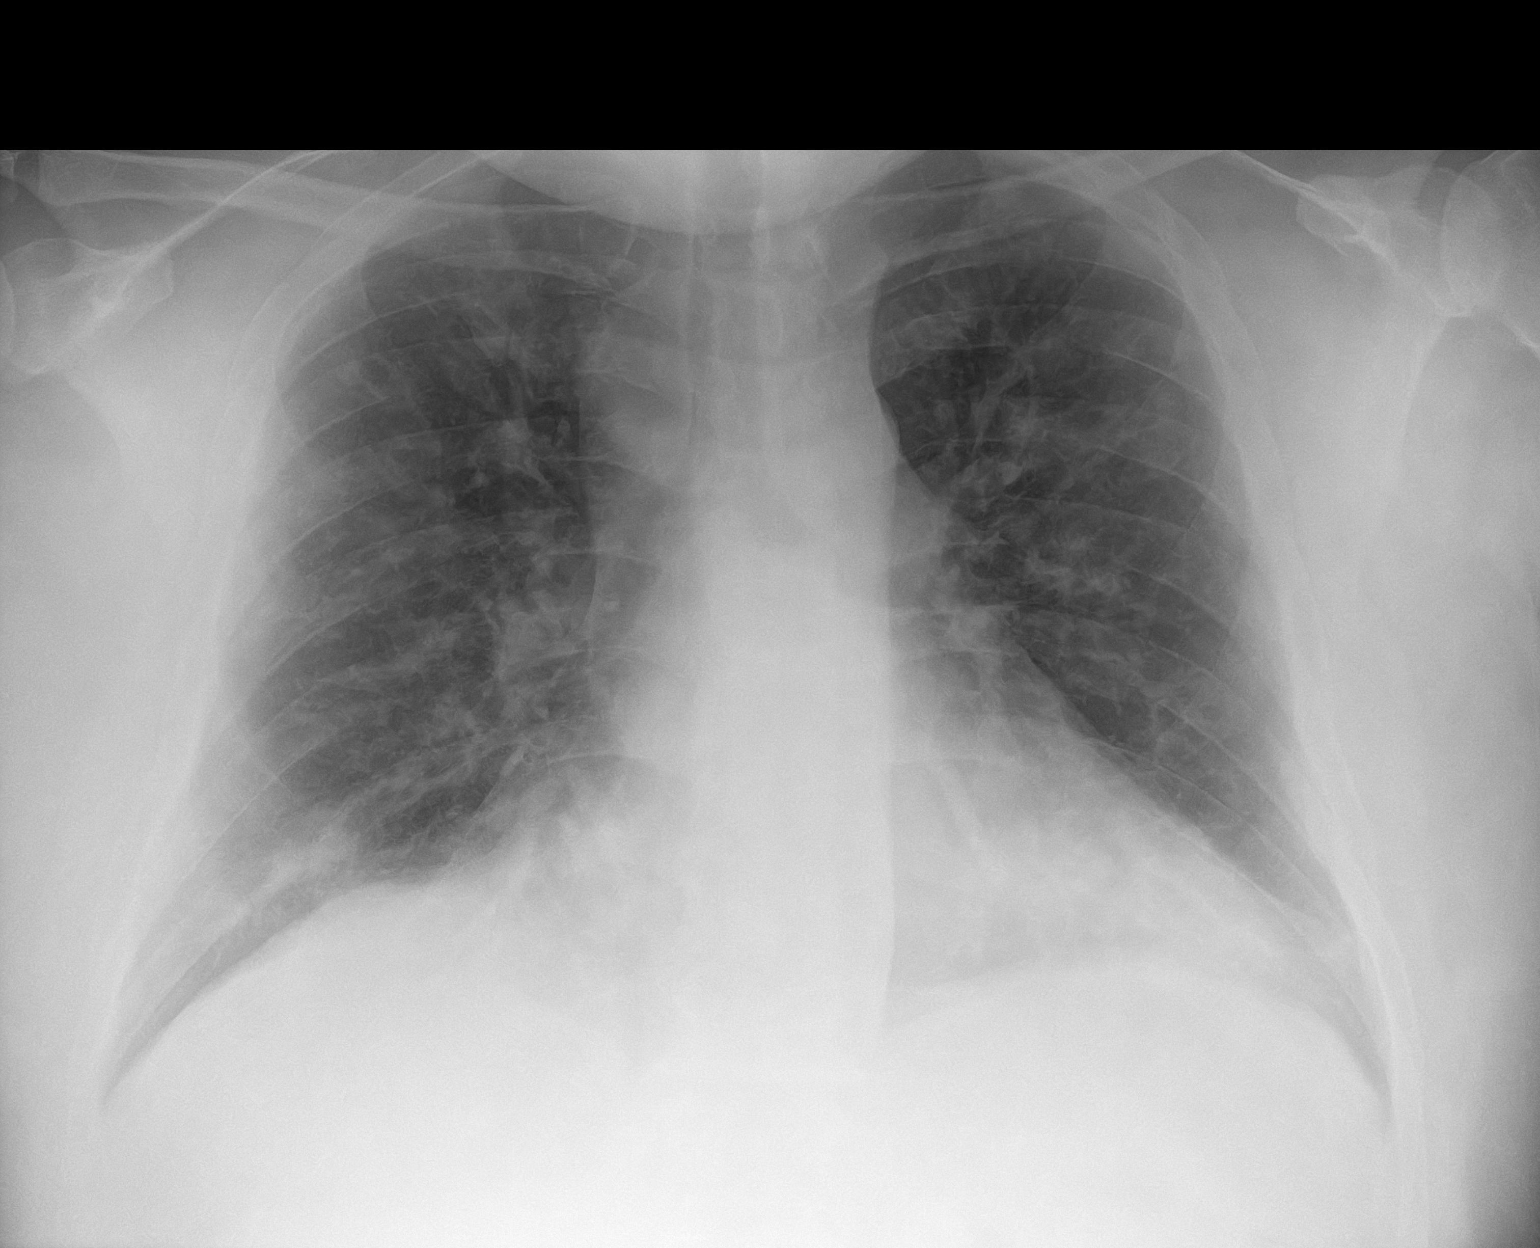

[1 of 1 positions shown; findings below may reference images not displayed]

FINDINGS: Numerous small mild to moderate severity diffuse bilateral
multifocal infiltrates are seen. This is slightly more prominent
within the bilateral lung bases. There is no evidence of a pleural
effusion or pneumothorax. The heart size and mediastinal contours
are within normal limits. The visualized skeletal structures are
unremarkable.
IMPRESSION: Numerous small to moderate severity diffuse bilateral multifocal
infiltrates. Follow-up to resolution is recommended to exclude the
presence of an underlying neoplastic process.
